# Patient Record
Sex: Female | Born: 1950
Health system: Southern US, Community
[De-identification: ages and names within clinical notes are randomized; demographics above are authoritative.]

## PROBLEM LIST (undated history)

## (undated) DIAGNOSIS — E669 Obesity, unspecified: Secondary | ICD-10-CM

## (undated) DIAGNOSIS — R011 Cardiac murmur, unspecified: Secondary | ICD-10-CM

## (undated) DIAGNOSIS — I10 Essential (primary) hypertension: Secondary | ICD-10-CM

## (undated) DIAGNOSIS — R509 Fever, unspecified: Secondary | ICD-10-CM

## (undated) DIAGNOSIS — E785 Hyperlipidemia, unspecified: Secondary | ICD-10-CM

## (undated) DIAGNOSIS — N3281 Overactive bladder: Secondary | ICD-10-CM

## (undated) DIAGNOSIS — G4700 Insomnia, unspecified: Secondary | ICD-10-CM

## (undated) HISTORY — DX: Essential (primary) hypertension: I10

## (undated) HISTORY — DX: Fever, unspecified: R50.9

## (undated) HISTORY — DX: Insomnia, unspecified: G47.00

## (undated) HISTORY — DX: Obesity, unspecified: E66.9

## (undated) HISTORY — DX: Overactive bladder: N32.81

## (undated) HISTORY — PX: TUBAL LIGATION: SHX77

## (undated) HISTORY — DX: Hyperlipidemia, unspecified: E78.5

---

## 2012-06-15 ENCOUNTER — Emergency Department (HOSPITAL_COMMUNITY): Payer: 59

## 2012-06-15 ENCOUNTER — Encounter (HOSPITAL_COMMUNITY): Payer: Self-pay | Admitting: Cardiology

## 2012-06-15 ENCOUNTER — Emergency Department (HOSPITAL_COMMUNITY)
Admission: EM | Admit: 2012-06-15 | Discharge: 2012-06-15 | Disposition: A | Discharge status: Home / Self Care | Payer: 59 | Attending: Emergency Medicine | Admitting: Emergency Medicine

## 2012-06-15 DIAGNOSIS — Z79899 Other long term (current) drug therapy: Secondary | ICD-10-CM | POA: Insufficient documentation

## 2012-06-15 DIAGNOSIS — M549 Dorsalgia, unspecified: Secondary | ICD-10-CM

## 2012-06-15 DIAGNOSIS — I10 Essential (primary) hypertension: Secondary | ICD-10-CM | POA: Insufficient documentation

## 2012-06-15 DIAGNOSIS — M545 Low back pain, unspecified: Secondary | ICD-10-CM | POA: Insufficient documentation

## 2012-06-15 HISTORY — DX: Essential (primary) hypertension: I10

## 2012-06-15 MED ORDER — OXYCODONE-ACETAMINOPHEN 5-325 MG PO TABS: 0.5000 | ORAL_TABLET | Freq: Four times a day (QID) | ORAL | Status: DC | PRN

## 2012-06-15 NOTE — ED Provider Notes (Signed)
History     CSN: [id number]  Arrival date & time 06/15/12  1156   First MD Initiated Contact with Patient 06/15/12 1219      Chief Complaint  Patient presents with  . Back Pain    (Consider location/radiation/quality/duration/timing/severity/associated sxs/prior treatment) HPI Comments: Patient presents with four-day history of lower back pain that started the day after cutting a tree in her yard. Patient states that she was outside all day. She complains of stiffness in her lower back. She saw an urgent care yesterday and prescribed a muscle relaxer which has not helped. She's been taking Aleve. Patient does not have any history of low back problems. She denies all red flag signs and symptoms of lower back pain, however she is age 62. No difficulty with ambulation. Onset of symptoms gradual. Course is constant. Nothing makes symptoms better. Movement and walking makes symptoms worse.  Patient is a 62 y.o. female presenting with back pain. The history is provided by the patient.  Back Pain Associated symptoms: no dysuria, no fever, no numbness, no pelvic pain and no weakness     Past Medical History  Diagnosis Date  . Hypertension     History reviewed. No pertinent past surgical history.  History reviewed. No pertinent family history.  History  Substance Use Topics  . Smoking status: Never Smoker   . Smokeless tobacco: Not on file  . Alcohol Use: No    OB History   Grav Para Term Preterm Abortions TAB SAB Ect Mult Living                  Review of Systems  Constitutional: Negative for fever and unexpected weight change.  Gastrointestinal: Negative for constipation.       Negative for fecal incontinence.   Genitourinary: Negative for dysuria, hematuria, flank pain, vaginal bleeding, vaginal discharge and pelvic pain.       Negative for urinary incontinence or retention.  Musculoskeletal: Positive for back pain.  Neurological: Negative for weakness and numbness.    Denies saddle paresthesias.    Allergies  Review of patient's allergies indicates no known allergies.  Home Medications   Current Outpatient Rx  Name  Route  Sig  Dispense  Refill  . hydrochlorothiazide (MICROZIDE) 12.5 MG capsule   Oral   Take 12.5 mg by mouth daily.         Marland Kitchen lisinopril (PRINIVIL,ZESTRIL) 20 MG tablet   Oral   Take 20 mg by mouth daily.         . naproxen sodium (ANAPROX) 220 MG tablet   Oral   Take 440 mg by mouth 2 (two) times daily as needed (for pain).         . simvastatin (ZOCOR) 20 MG tablet   Oral   Take 20 mg by mouth daily.           BP 139/53  Pulse 60  Temp(Src) 98.3 F (36.8 C) (Oral)  Resp 20  SpO2 99%  Physical Exam  Nursing note and vitals reviewed. Constitutional: She appears well-developed and well-nourished.  HENT:  Head: Normocephalic and atraumatic.  Eyes: Conjunctivae are normal.  Neck: Normal range of motion. Neck supple.  Pulmonary/Chest: Effort normal.  Abdominal: Soft. There is no tenderness. There is no CVA tenderness.  Musculoskeletal: Normal range of motion.       Cervical back: She exhibits no tenderness and no bony tenderness.       Thoracic back: She exhibits no tenderness and no bony tenderness.  Lumbar back: She exhibits tenderness. She exhibits normal range of motion and no bony tenderness.       Back:  No step-off noted with palpation of spine.   Neurological: She is alert. She has normal strength and normal reflexes. No sensory deficit.  5/5 strength in entire lower extremities bilaterally. No sensation deficit.   Skin: Skin is warm and dry. No rash noted.  Psychiatric: She has a normal mood and affect.    ED Course  Procedures (including critical care time)  Labs Reviewed - No data to display Dg Lumbar Spine Complete  06/15/2012  *RADIOLOGY REPORT*  Clinical Data: Back pain, injury  LUMBAR SPINE - COMPLETE 4+ VIEW  Comparison: None.  Findings: Normal lumbar spine alignment.  Preserved  vertebral body heights.  Mild diffuse degenerative disc disease and facet arthropathy.  No compression fracture, wedge shaped deformity or focal kyphosis.  No pars defects.  Pedicles appear intact.  Minor SI joint arthropathy.  Nonobstructive bowel gas pattern.  IMPRESSION: Degenerative changes.  No acute finding   Original Report Authenticated By: Judie Petit. Shick, M.D.      1. Back pain     12:52 PM Patient seen and examined. Work-up initiated. Pt requests back x-ray.   Vital signs reviewed and are as follows: Filed Vitals:   06/15/12 1202  BP: 139/53  Pulse: 60  Temp: 98.3 F (36.8 C)  Resp: 20   Result of back x-ray reviewed by myself. Patient informed.  No red flag s/s of low back pain. Patient was counseled on back pain precautions and told to do activity as tolerated but do not lift, push, or pull heavy objects more than 10 pounds for the next week.  Patient counseled to use ice or heat on back for no longer than 15 minutes every hour.   Patient prescribed narcotic pain medicine and counseled on proper use of narcotic pain medications. Counseled not to combine this medication with others containing tylenol. Told patient to discontinue home Tylenol #3.  Urged patient not to drink alcohol, drive, or perform any other activities that requires focus while taking either of these medications.  Patient urged to follow-up with PCP if pain does not improve with treatment and rest or if pain becomes recurrent. Urged to return with worsening severe pain, loss of bowel or bladder control, trouble walking.   The patient verbalizes understanding and agrees with the plan.    MDM  Patient with back pain. X-ray performed per patient request as well as age. No neurological deficits. Patient is ambulatory. No warning symptoms of back pain including: loss of bowel or bladder control, night sweats, waking from sleep with back pain, unexplained fevers or weight loss, h/o cancer, IVDU, recent trauma. No  concern for cauda equina, epidural abscess, or other serious cause of back pain. Conservative measures such as rest, ice/heat and pain medicine indicated with PCP follow-up if no improvement with conservative management.          Renne Crigler, PA-C 06/15/12 1458

## 2012-06-15 NOTE — ED Provider Notes (Signed)
Medical screening examination/treatment/procedure(s) were performed by non-physician practitioner and as supervising physician I was immediately available for consultation/collaboration.  Gloriana Piltz T Florine Sprenkle, MD 06/15/12 1640 

## 2012-06-15 NOTE — ED Notes (Signed)
Pt reports she worked in her yard on Sunday cutting up trees and is now having lower back pain. States she went to [facility] and given some Robaxin but it only makes her sleepy. Sensation intact.

## 2012-06-15 NOTE — ED Notes (Signed)
C/O lower right back pain down right thigh. No numbness.

## 2013-02-18 ENCOUNTER — Encounter (HOSPITAL_COMMUNITY): Payer: Self-pay | Admitting: Emergency Medicine

## 2013-02-18 ENCOUNTER — Emergency Department (HOSPITAL_COMMUNITY)
Admission: EM | Admit: 2013-02-18 | Discharge: 2013-02-18 | Disposition: A | Discharge status: Home / Self Care | Payer: 59 | Source: Home / Self Care | Attending: Family Medicine | Admitting: Family Medicine

## 2013-02-18 DIAGNOSIS — H1045 Other chronic allergic conjunctivitis: Secondary | ICD-10-CM

## 2013-02-18 DIAGNOSIS — H1013 Acute atopic conjunctivitis, bilateral: Secondary | ICD-10-CM

## 2013-02-18 NOTE — ED Notes (Signed)
Pt c/o eyes being irritated x 2 days. 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 She doesn't feel any foreign body in her eyes just irritation. Pt is alert and oriented and in no acute distress.

## 2013-02-18 NOTE — ED Provider Notes (Signed)
CSN: [id number]     Arrival date & time 02/18/13  1805 History   First MD Initiated Contact with Patient 02/18/13 1813     Chief Complaint  Patient presents with  . Eye Problem   (Consider location/radiation/quality/duration/timing/severity/associated sxs/prior Treatment) Patient is a 62 y.o. female presenting with eye problem. The history is provided by the patient.  Eye Problem Location:  Both Quality:  Burning Severity:  Mild Onset quality:  Gradual Duration:  2 days Progression:  Unchanged Chronicity:  New Relieved by:  None tried Associated symptoms: itching and tearing   Associated symptoms: no blurred vision, no crusting, no decreased vision, no discharge, no photophobia and no redness   Associated symptoms comment:  Gritty eye irritation. Risk factors comment:  Cheap eye make-up   Past Medical History  Diagnosis Date  . Hypertension    History reviewed. No pertinent past surgical history. No family history on file. History  Substance Use Topics  . Smoking status: Never Smoker   . Smokeless tobacco: Not on file  . Alcohol Use: No   OB History   Grav Para Term Preterm Abortions TAB SAB Ect Mult Living                 Review of Systems  Constitutional: Negative.   HENT: Negative.   Eyes: Positive for itching. Negative for blurred vision, photophobia, pain, discharge, redness and visual disturbance.    Allergies  Review of patient's allergies indicates no known allergies.  Home Medications   Current Outpatient Rx  Name  Route  Sig  Dispense  Refill  . hydrochlorothiazide (MICROZIDE) 12.5 MG capsule   Oral   Take 12.5 mg by mouth daily.         Marland Kitchen lisinopril (PRINIVIL,ZESTRIL) 20 MG tablet   Oral   Take 20 mg by mouth daily.         . naproxen sodium (ANAPROX) 220 MG tablet   Oral   Take 440 mg by mouth 2 (two) times daily as needed (for pain).         Marland Kitchen oxyCODONE-acetaminophen (PERCOCET/ROXICET) 5-325 MG per tablet   Oral   Take 0.5-1  tablets by mouth every 6 (six) hours as needed for pain.   8 tablet   0   . simvastatin (ZOCOR) 20 MG tablet   Oral   Take 20 mg by mouth daily.          BP 137/87  Pulse 86  Temp(Src) 98.4 F (36.9 C) (Oral)  Resp 19  SpO2 97% Physical Exam  Nursing note and vitals reviewed. Constitutional: She appears well-developed and well-nourished.  HENT:  Head: Normocephalic.  Right Ear: External ear normal.  Left Ear: External ear normal.  Mouth/Throat: Oropharynx is clear and moist.  Eyes: Conjunctivae and EOM are normal. Pupils are equal, round, and reactive to light. Right eye exhibits no discharge. Left eye exhibits no discharge. Right conjunctiva is not injected. Left conjunctiva is not injected.  Slit lamp exam:      The right eye shows no corneal abrasion, no corneal ulcer, no foreign body and no fluorescein uptake.      ED Course  Procedures (including critical care time) Labs Review Labs Reviewed - No data to display Imaging Review No results found.  EKG Interpretation    Date/Time:    Ventricular Rate:    PR Interval:    QRS Duration:   QT Interval:    QTC Calculation:   R Axis:  Text Interpretation:              MDM      Linna Hoff, MD 02/19/13 2114

## 2013-06-12 ENCOUNTER — Other Ambulatory Visit (HOSPITAL_COMMUNITY)
Admission: RE | Admit: 2013-06-12 | Discharge: 2013-06-12 | Disposition: A | Discharge status: Home / Self Care | Payer: 59 | Source: Ambulatory Visit | Attending: Family Medicine | Admitting: Family Medicine

## 2013-06-12 DIAGNOSIS — Z124 Encounter for screening for malignant neoplasm of cervix: Secondary | ICD-10-CM | POA: Insufficient documentation

## 2013-06-13 ENCOUNTER — Other Ambulatory Visit: Payer: Self-pay | Admitting: Family Medicine

## 2014-06-11 ENCOUNTER — Telehealth: Payer: Self-pay | Admitting: Cardiology

## 2014-06-12 ENCOUNTER — Encounter: Payer: Self-pay | Admitting: Cardiology

## 2014-06-12 ENCOUNTER — Telehealth: Payer: Self-pay | Admitting: Cardiology

## 2014-06-12 ENCOUNTER — Ambulatory Visit (INDEPENDENT_AMBULATORY_CARE_PROVIDER_SITE_OTHER): Payer: 59 | Admitting: Cardiology

## 2014-06-12 VITALS — BP 158/80 | HR 58 | Ht 63.0 in | Wt 203.5 lb

## 2014-06-12 DIAGNOSIS — I10 Essential (primary) hypertension: Secondary | ICD-10-CM

## 2014-06-12 DIAGNOSIS — R0609 Other forms of dyspnea: Secondary | ICD-10-CM

## 2014-06-12 DIAGNOSIS — R0600 Dyspnea, unspecified: Secondary | ICD-10-CM

## 2014-06-12 DIAGNOSIS — I359 Nonrheumatic aortic valve disorder, unspecified: Secondary | ICD-10-CM

## 2014-06-12 DIAGNOSIS — E785 Hyperlipidemia, unspecified: Secondary | ICD-10-CM

## 2014-06-12 DIAGNOSIS — G479 Sleep disorder, unspecified: Secondary | ICD-10-CM

## 2014-06-12 DIAGNOSIS — R079 Chest pain, unspecified: Secondary | ICD-10-CM

## 2014-06-12 NOTE — Patient Instructions (Signed)
Your physician has requested that you have an echocardiogram. Echocardiography is a painless test that uses sound waves to create images of your heart. It provides your doctor with information about the size and shape of your heart and how well your heart's chambers and valves are working. This procedure takes approximately one hour. There are no restrictions for this procedure.  Your physician has requested that you have en exercise stress myoview. For further information please visit [email or web address]. Please follow instruction sheet, as given.   Your physician has recommended that you have a sleep study. This test records several body functions during sleep, including: brain activity, eye movement, oxygen and carbon dioxide blood levels, heart rate and rhythm, breathing rate and rhythm, the flow of air through your mouth and nose, snoring, body muscle movements, and chest and belly movement.  Your physician recommends that you schedule a follow-up appointment after test echo , myoview --Dr Ellyn Hack

## 2014-06-12 NOTE — Telephone Encounter (Signed)
Patient will call to schedule the Split night sleep study ordered by Dr. Ellyn Hack.  She wants to check with her insurance company first.

## 2014-06-13 ENCOUNTER — Encounter (HOSPITAL_COMMUNITY): Payer: Self-pay | Admitting: *Deleted

## 2014-06-14 ENCOUNTER — Encounter: Payer: Self-pay | Admitting: Cardiology

## 2014-06-14 DIAGNOSIS — R0609 Other forms of dyspnea: Secondary | ICD-10-CM

## 2014-06-14 DIAGNOSIS — R079 Chest pain, unspecified: Secondary | ICD-10-CM | POA: Insufficient documentation

## 2014-06-14 DIAGNOSIS — I10 Essential (primary) hypertension: Secondary | ICD-10-CM | POA: Insufficient documentation

## 2014-06-14 DIAGNOSIS — I359 Nonrheumatic aortic valve disorder, unspecified: Secondary | ICD-10-CM | POA: Insufficient documentation

## 2014-06-14 DIAGNOSIS — E785 Hyperlipidemia, unspecified: Secondary | ICD-10-CM | POA: Insufficient documentation

## 2014-06-14 DIAGNOSIS — R0600 Dyspnea, unspecified: Secondary | ICD-10-CM | POA: Insufficient documentation

## 2014-06-14 DIAGNOSIS — G479 Sleep disorder, unspecified: Secondary | ICD-10-CM | POA: Insufficient documentation

## 2014-06-14 NOTE — Assessment & Plan Note (Signed)
L sided chest pain & arm tingling with  - relieved with rest in a 64 y/o obese post-menopausal woman with HTN, HLD (likely Metabolic Syndrome) -- at least moderate if not higher risk of cardiac etiology.  Plan: Will attempt TM Myoview - but option to convert to [place] (she is not sure if her back-leg pain will let her complete the [facility] Protocol) PRN NTG Rx On Statkin & ACE-I - hold off on BB given HR 58 @ baseline.

## 2014-06-14 NOTE — Telephone Encounter (Signed)
Close encounter 

## 2014-06-14 NOTE — Assessment & Plan Note (Signed)
SEM on exam with h/o severe childhood illness - ? Rheumatic Fever.  Not sure if this could be be delayed presentation of Rheumatic Valve disease.  Check 2D Echo

## 2014-06-14 NOTE — Progress Notes (Signed)
PATIENT: Linda Harvey MRN: [id number] DOB: 03/13/1951 PCP: Reginia Naas, MD  Clinic Note: Chief Complaint  Patient presents with  . New Evaluation    WHEN WALKING UP HILL HAS CHEST DISCOMFORT OR OVER EXERTION , NO PROBLEM WHEN DOING WATER AEROBIC , NO SOB , NO EDEMA- NO ENERGY  . Chest Pain    HPI: Linda Harvey is a 64 y.o. female with a PMH below who presents today for initial cardiology consultation for exertional chest discomfort and arm pain.  She has been trying to get increasing exercise when walking with her husband she was seen by her PCP on March 8th , and is now with cardiology evaluation.  Interval History: She is a mildly obese woman who seems somewhat depressed affect today.  She has been trying to do more exercise with her husband, and started noticing left-sided chest pressure and a left sided arm tingling associated with dyspnea.  She also will feel somewhat queasy after exercising.  She does not note these symptoms with just daily walking around, only when she is "walking with a purpose".  She has not noted any of these symptoms when doing her water aerobic exercises.  The symptoms are ~4-5/10 in intensity & are alleviated with rest.  She occasionally notes having burst of rapid HR but no sense of passing out.   In addition to her exertional symptoms, she essentially notes feeling tired & lethargic during the day & never feels as though she gets a good night of rest.   Her Epworth Scale is as follows: Situation   Chance of Dozing/Sleeping (0 = never , 1 = slight chance , 2 = moderate chance , 3 = high chance )   sitting and reading 3   watching TV 3   sitting inactive in a public place 3   being a passenger in a motor vehicle for an hour or more 2   lying down in the afternoon 3   sitting and talking to someone 1   sitting quietly after lunch (no alcohol) 3   while stopped for a few minutes in traffic as the driver 0   Total Score  18   The remainder of  her Cardiovascular ROS  is as follows: positive for - chest pain, dyspnea on exertion, rapid heart rate and L arm tingling, fatigue negative for - edema, irregular heartbeat, loss of consciousness, orthopnea, palpitations, paroxysmal nocturnal dyspnea, shortness of breath or TIA/amaurosis fugax, syncope/near syncope :  Past Medical History  Diagnosis Date  . Essential hypertension   . Hyperlipidemia   . Obesity (BMI 35.0-39.9 without comorbidity)   . Insomnia   . Overactive bladder   . Severe Acute Febrile Illness as Child     Prior Cardiac Evaluation and Past Surgical History: Past Surgical History  Procedure Laterality Date  . Tubal ligation Bilateral     No Known Allergies  Current Outpatient Prescriptions  Medication Sig Dispense Refill  . hydrochlorothiazide (MICROZIDE) 12.5 MG capsule Take 12.5 mg by mouth daily.    Marland Kitchen lisinopril (PRINIVIL,ZESTRIL) 20 MG tablet Take 20 mg by mouth daily.    . naproxen sodium (ANAPROX) 220 MG tablet Take 440 mg by mouth 2 (two) times daily as needed (for pain).    Marland Kitchen NITROSTAT 0.4 MG SL tablet     . oxybutynin (DITROPAN) 5 MG tablet Take 5 mg by mouth 2 (two) times daily.  5  . simvastatin (ZOCOR) 20 MG tablet Take 20 mg by mouth daily.  No current facility-administered medications for this visit.   History   Social History  . Marital Status: Married    Spouse Name: N/A  . Number of Children: N/A  . Years of Education: N/A   Social History Main Topics  . Smoking status: Never Smoker   . Smokeless tobacco: Not on file  . Alcohol Use: No  . Drug Use: No  . Sexual Activity: Not on file   Other Topics Concern  . None   Social History Narrative   Lives with he husband - likes to go walking with him   Notes that has not had any "sex drive" x ~2 yrs.   Never smoked.  No EtOH.    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   family history includes Cancer in her father; Diabetes in her mother and son; Epilepsy in her son.  ROS: A  comprehensive Review of Systems - was performed. Review of Systems  Constitutional: Positive for malaise/fatigue. Negative for weight loss.  Cardiovascular: Positive for palpitations.  Gastrointestinal: Negative for abdominal pain, blood in stool and melena.  Genitourinary: Negative for hematuria.  Musculoskeletal: Positive for joint pain. Negative for myalgias.       L leg - ? Neuropathic shooting pain when walking & occasionally @ night  Neurological: Negative.  Negative for sensory change, speech change and focal weakness.  Endo/Heme/Allergies: Negative.   Psychiatric/Behavioral: The patient is nervous/anxious and has insomnia.        Notes anhedonia, poor eating habits, poor sleeping habits & appears to have low self esteem  All other systems reviewed and are negative.   PHYSICAL EXAM BP 158/80 mmHg  Pulse 58  Ht 5\' 3"  (1.6 m)  Wt 203 lb 8 oz (92.307 kg)  BMI 36.06 kg/m2 General appearance: alert, cooperative, appears stated age, no distress, moderately obese and depressed mood & affect Neck: no adenopathy, no carotid bruit, supple, symmetrical, trachea midline and JVP ~10-12 cmH20 with mild HJR Lungs: clear to auscultation bilaterally, normal percussion bilaterally and non-labored, good air movement Heart: RRR, normal S1 & S2; 2-3/6 c-d SEM @ RUSB--> carotid; non-displaced PMI, NO R/G Abdomen: soft, non-tender; bowel sounds normal; no masses,  no organomegaly and truncal & waist/buttock obesity Extremities: No C/C with ~1+ Bilateral ankle edema Pulses: 2+ and symmetric Skin: Skin color, texture, turgor normal. No rashes or lesions Neurologic: Alert and oriented X 3, normal strength and tone. Normal symmetric reflexes. Normal coordination and gait Cranial nerves: normal   Adult ECG Report  Rate: 58 ;  Rhythm: sinus bradycardia  QRS Axis: -19 (leftward) ;  PR Interval: 126 ;  QRS Duration: 90 ; QTc: 398;  Voltages: minimal criteria for LVH  Conduction Disturbances: none  Other  Abnormalities: non-specific ST-T changes  Narrative Interpretation: Mildly abnormal EKG but no ischemic findings.  Recent Labs: pending labs checked by PCP.  ASSESSMENT / PLAN:  Chest pain with moderate risk for cardiac etiology L sided chest pain & arm tingling with  - relieved with rest in a 64 y/o obese post-menopausal woman with HTN, HLD (likely Metabolic Syndrome) -- at least moderate if not higher risk of cardiac etiology.  Plan: Will attempt TM Myoview - but option to convert to [place] (she is not sure if her back-leg pain will let her complete the [facility] Protocol) PRN NTG Rx On Statkin & ACE-I - hold off on BB given HR 58 @ baseline.   DOE (dyspnea on exertion) May be multifactorial, but concerning for Anginal equivalent -->  see above Also checking 2 D Echo    Aortic valve disease - Systolic ejection murmur SEM on exam with h/o severe childhood illness - ? Rheumatic Fever.  Not sure if this could be be delayed presentation of Rheumatic Valve disease.  Check 2D Echo   Difficulty sleeping She has a Dx of Insomnia - but with Epworth Scale of 18, concern is for OSA  Plan: Sleep Studye (Polysomnogram)     Orders Placed This Encounter  Procedures  . Myocardial Perfusion Imaging    Standing Status: Future     Number of Occurrences:      Standing Expiration Date: 06/12/2015    Order Specific Question:  Where should this test be performed    Answer:  [facility]    Order Specific Question:  Type of stress    Answer:  Exercise    Order Specific Question:  Patient weight in lbs    Answer:  203  . EKG 12-Lead  . 2D Echocardiogram without contrast    Standing Status: Future     Number of Occurrences:      Standing Expiration Date: 06/12/2015    Order Specific Question:  Type of Echo    Answer:  Complete    Order Specific Question:  Where should this test be performed    Answer:  [facility]    Order Specific Question:  Reason for exam-Echo    Answer:   Murmur  785.2 / R01.1    Order Specific Question:  Reason for exam-Echo    Answer:  Dyspnea  786.09 / R06.00    Order Specific Question:  Reason for exam-Echo    Answer:  Chest Pain  786.50 / R07.9  . Split night study    Standing Status: Future     Number of Occurrences:      Standing Expiration Date: 06/13/2015    Scheduling Instructions:     EPIWORTH SCORE OF 18     PLEASE HAVE DR [place] STUDY    Order Specific Question:  Where should this test be performed:    Answer:  [place] ordered this encounter  Medications  . oxybutynin (DITROPAN) 5 MG tablet    Sig: Take 5 mg by mouth 2 (two) times daily.    Refill:  5  . NITROSTAT 0.4 MG SL tablet    Sig:     Followup: ~1 months  DAVID W. Ellyn Hack, M.D., M.S. Interventional Cardiolgy [facility]

## 2014-06-14 NOTE — Assessment & Plan Note (Signed)
May be multifactorial, but concerning for Anginal equivalent --> see above Also checking 2 D Echo

## 2014-06-14 NOTE — Assessment & Plan Note (Signed)
She has a Dx of Insomnia - but with Epworth Scale of 18, concern is for OSA  Plan: Sleep Studye (Polysomnogram)

## 2014-06-20 ENCOUNTER — Telehealth (HOSPITAL_COMMUNITY): Payer: Self-pay

## 2014-06-20 NOTE — Telephone Encounter (Signed)
Encounter complete. 

## 2014-06-21 ENCOUNTER — Encounter: Payer: Self-pay | Admitting: *Deleted

## 2014-06-25 ENCOUNTER — Ambulatory Visit (HOSPITAL_COMMUNITY)
Admission: RE | Admit: 2014-06-25 | Discharge: 2014-06-25 | Disposition: A | Discharge status: Home / Self Care | Payer: 59 | Source: Ambulatory Visit | Attending: Cardiovascular Disease | Admitting: Cardiovascular Disease

## 2014-06-25 ENCOUNTER — Ambulatory Visit (HOSPITAL_BASED_OUTPATIENT_CLINIC_OR_DEPARTMENT_OTHER)
Admission: RE | Admit: 2014-06-25 | Discharge: 2014-06-25 | Disposition: A | Discharge status: Home / Self Care | Payer: 59 | Source: Ambulatory Visit | Attending: Cardiovascular Disease | Admitting: Cardiovascular Disease

## 2014-06-25 DIAGNOSIS — R0600 Dyspnea, unspecified: Secondary | ICD-10-CM | POA: Diagnosis not present

## 2014-06-25 DIAGNOSIS — R42 Dizziness and giddiness: Secondary | ICD-10-CM | POA: Diagnosis not present

## 2014-06-25 DIAGNOSIS — I359 Nonrheumatic aortic valve disorder, unspecified: Secondary | ICD-10-CM | POA: Insufficient documentation

## 2014-06-25 DIAGNOSIS — R079 Chest pain, unspecified: Secondary | ICD-10-CM | POA: Diagnosis not present

## 2014-06-25 DIAGNOSIS — E785 Hyperlipidemia, unspecified: Secondary | ICD-10-CM | POA: Insufficient documentation

## 2014-06-25 DIAGNOSIS — R9431 Abnormal electrocardiogram [ECG] [EKG]: Secondary | ICD-10-CM | POA: Diagnosis not present

## 2014-06-25 DIAGNOSIS — E669 Obesity, unspecified: Secondary | ICD-10-CM | POA: Insufficient documentation

## 2014-06-25 DIAGNOSIS — I10 Essential (primary) hypertension: Secondary | ICD-10-CM | POA: Diagnosis not present

## 2014-06-25 DIAGNOSIS — G479 Sleep disorder, unspecified: Secondary | ICD-10-CM | POA: Insufficient documentation

## 2014-06-25 DIAGNOSIS — R0609 Other forms of dyspnea: Secondary | ICD-10-CM

## 2014-06-25 HISTORY — PX: NM MYOVIEW LTD: HXRAD82

## 2014-06-25 HISTORY — PX: TRANSTHORACIC ECHOCARDIOGRAM: SHX275

## 2014-06-25 MED ORDER — TECHNETIUM TC 99M SESTAMIBI GENERIC - CARDIOLITE
30.0000 | Freq: Once | INTRAVENOUS | Status: AC | PRN
  Administered 2014-06-25: 30 via INTRAVENOUS

## 2014-06-25 MED ORDER — TECHNETIUM TC 99M SESTAMIBI GENERIC - CARDIOLITE
10.0000 | Freq: Once | INTRAVENOUS | Status: AC | PRN
  Administered 2014-06-25: 10 via INTRAVENOUS

## 2014-06-25 NOTE — Progress Notes (Signed)
Quick Note:  Stress Test looked good!! No sign of significant Heart Artery Disease. Pump function is normal.  Good news!!.  HARDING,DAVID W, MD  ______ 

## 2014-06-25 NOTE — Progress Notes (Signed)
2D Echocardiogram Complete.  06/25/2014   Eran Windish, RDCS  

## 2014-06-25 NOTE — Procedures (Addendum)
[place] [facility] [address] [place] [address] [phone]  Cardiology Nuclear Med Study  Linda Harvey is a 64 y.o. female     MRN : [id number]     DOB: 04/28/1950  Procedure Date: 06/25/2014  Nuclear Med Background Indication for Stress Test:  Evaluation for Ischemia and Abnormal EKG History:  AVDs w/systolic ejection murmur;No prior respiratory history reported; N prior NUC MPI for comparison. Cardiac Risk Factors: Hypertension, Lipids and Obesity  Symptoms:  Chest Pain, DOE and Light-Headedness   Nuclear Pre-Procedure Caffeine/Decaff Intake:  7:00pm NPO After: 3:00am   IV Site: R Antecubital  IV 0.9% NS with Angio Cath:  22g  Chest Size (in):  n/a IV Started by: Rolene Course, RN  Height: 5\' 3"  (1.6 m)  Cup Size: C  BMI:  Body mass index is 35.97 kg/(m^2). Weight:  203 lb (92.08 kg)   Tech Comments:  n/a    Nuclear Med Study 1 or 2 day study: 1 day  Stress Test Type:  Stress  Order Authorizing Provider:  Glenetta Hew, MD   Resting Radionuclide: Technetium 6m Sestamibi  Resting Radionuclide Dose: 10.4 mCi   Stress Radionuclide:  Technetium 31m Sestamibi  Stress Radionuclide Dose: 30.8 mCi           Stress Protocol Rest HR: 55 Stress HR: 144  Rest BP: 152/87 Stress BP: 204/124  Exercise Time (min): 7:34 METS: 9.4   Predicted Max HR: 156 bpm % Max HR: 92.31 bpm Rate Pressure Product: [phone]  Dose of Adenosine (mg):  n/a Dose of Lexiscan: n/a mg  Dose of Atropine (mg): n/a Dose of Dobutamine: n/a mcg/kg/min (at max HR)  Stress Test Technologist: Leane Para, CCT Nuclear Technologist: Anthony Sar   Rest Procedure:  Myocardial perfusion imaging was performed at rest 45 minutes following the intravenous administration of Technetium 61m Sestamibi. Stress Procedure:  The patient performed treadmill exercise using a Bruce  Protocol for 7:34 minutes. The patient stopped due to HTN and SOB and denied  any chest pain.  There were no significant ST-T wave changes.  Technetium 66m Sestamibi was injected at peak exercise and myocardial perfusion imaging was performed after a brief delay.  Transient Ischemic Dilatation (Normal <1.22):  1.07  QGS EDV:  72 ml QGS ESV:  22 ml LV Ejection Fraction: 70%       Rest ECG: NSR - Normal EKG  Stress ECG: Significant ST abnormalities consistent with ischemia.  QPS Raw Data Images:  Normal; no motion artifact; normal heart/lung ratio. Stress Images:  Normal homogeneous uptake in all areas of the myocardium. Rest Images:  Normal homogeneous uptake in all areas of the myocardium. Subtraction (SDS):  No evidence of ischemia.  Impression Exercise Capacity:  Good exercise capacity. BP Response:  Hypertensive blood pressure response. Clinical Symptoms:  No significant symptoms noted. ECG Impression:  Significant ST abnormalities consistent with ischemia. Comparison with Prior Nuclear Study: No images to compare  Overall Impression:  Low risk stress nuclear study Nl perfusion with + GXT.  LV Wall Motion:  NL LV Function; NL Wall Motion   [name], MD  06/25/2014 1:12 PM

## 2014-06-26 ENCOUNTER — Telehealth: Payer: Self-pay | Admitting: *Deleted

## 2014-06-26 NOTE — Telephone Encounter (Signed)
-----   Message from Leonie Man, MD sent at 06/25/2014  9:41 PM EDT ----- Stress Test looked good!! No sign of significant Heart Artery Disease.  Pump function is normal.  Good news!!.  Leonie Man, MD

## 2014-06-26 NOTE — Progress Notes (Signed)
Quick Note:  Echo results: Good news: Essentially pretty good echocardiogram --> normal pump function and normal valve function (Tricuspid regurgitation is not all that concerning  EF: 55-60%. Grade 1 Diastolic Dysfunction = not unexpected for age --> indicates mlid stiffening (poor relaxation / filling of the pumping chamber) -- > can be related to Exertional Dyspnea (if strenuous, or if also deconditioned) No regional wall motion abnormalities = No signs to suggest heart attack..   DH  ______

## 2014-06-26 NOTE — Telephone Encounter (Signed)
SPOKE TO SON RESULTS GIVEN HE STATES HE WILL RELAY MESSAGE.

## 2014-06-26 NOTE — Telephone Encounter (Signed)
-----   Message from Leonie Man, MD sent at 06/26/2014  1:29 AM EDT ----- Echo results: Good news: Essentially pretty good echocardiogram --> normal pump function and normal valve function (Tricuspid regurgitation is not all that concerning   EF: 55-60%. Grade 1 Diastolic Dysfunction = not unexpected for age --> indicates mlid stiffening (poor relaxation / filling of the pumping chamber) -- > can be related to Exertional Dyspnea (if strenuous, or if also deconditioned) No regional wall motion abnormalities =  No signs to suggest heart attack.Marland Kitchen   Linda Harvey

## 2014-07-15 ENCOUNTER — Encounter: Payer: Self-pay | Admitting: Cardiology

## 2014-07-15 ENCOUNTER — Ambulatory Visit (INDEPENDENT_AMBULATORY_CARE_PROVIDER_SITE_OTHER): Payer: 59 | Admitting: Cardiology

## 2014-07-15 VITALS — BP 128/80 | HR 68 | Ht 64.0 in | Wt 185.0 lb

## 2014-07-15 DIAGNOSIS — I10 Essential (primary) hypertension: Secondary | ICD-10-CM | POA: Diagnosis not present

## 2014-07-15 DIAGNOSIS — G479 Sleep disorder, unspecified: Secondary | ICD-10-CM | POA: Diagnosis not present

## 2014-07-15 DIAGNOSIS — R0609 Other forms of dyspnea: Secondary | ICD-10-CM

## 2014-07-15 DIAGNOSIS — R079 Chest pain, unspecified: Secondary | ICD-10-CM

## 2014-07-15 DIAGNOSIS — R0600 Dyspnea, unspecified: Secondary | ICD-10-CM

## 2014-07-15 DIAGNOSIS — I359 Nonrheumatic aortic valve disorder, unspecified: Secondary | ICD-10-CM

## 2014-07-15 DIAGNOSIS — E785 Hyperlipidemia, unspecified: Secondary | ICD-10-CM

## 2014-07-15 NOTE — Progress Notes (Signed)
PATIENT: Linda Harvey MRN: [id number] DOB: May 18, 1950 PCP: Reginia Naas, MD  Clinic Note: Chief Complaint  Patient presents with  . Follow-up    Echo and stress test  . Chest Pain  . Shortness of Breath    HPI: Linda Harvey is a 64 y.o. female with a PMH below who presents today for initial cardiology consultation for exertional chest discomfort and arm pain.  She has been trying to get increasing exercise when walking with her husband she was seen by her PCP on March 8th , and is now with cardiology evaluation.  Interval History: No longer doing strenuous exertion with hiking, but able to do routine TM & has really gotten into doing swimming exercises. No more palpitations.  Did well on GXT portion of ST - but hadf EKG changes - no ischemia on imaging (LOW RISK). Since then, no further CP-arm.  Better energy level.  No palpitations.  No DOE.   Cardiovascular ROS  is as follows: no chest pain or dyspnea on exertion positive for - rapid heart rate and palpitations are better negative for - edema, irregular heartbeat, loss of consciousness, orthopnea, palpitations, paroxysmal nocturnal dyspnea, rapid heart rate, shortness of breath or TIA/amaurosis fugax, syncope/near syncope :  Lots of stress caring for disabled son.  Her husband wants to move & that will leave son without a caregiver.  She is feeling torn.  Past Medical History  Diagnosis Date  . Essential hypertension   . Hyperlipidemia   . Obesity (BMI 35.0-39.9 without comorbidity)   . Insomnia   . Overactive bladder   . Severe Acute Febrile Illness as Child     Prior Cardiac Evaluation and Past Surgical History: Past Surgical History  Procedure Laterality Date  . Tubal ligation Bilateral   . Transthoracic echocardiogram  06/25/14     LV size & function - EF 55-60%, Gr 1 DD, mild-mod TR, Tr MR; Mild Aortic Sclerosis  . Nm myoview ltd  06/25/14    Low Risk ST - normal perfusion, abnormal GXT, EF ~70%   No  Known Allergies  Current Outpatient Prescriptions  Medication Sig Dispense Refill  . lisinopril-hydrochlorothiazide (PRINZIDE,ZESTORETIC) 20-25 MG per tablet Take 1 tablet by mouth daily.    Marland Kitchen NITROSTAT 0.4 MG SL tablet Place 0.4 mg under the tongue every 5 (five) minutes as needed.     Marland Kitchen oxybutynin (DITROPAN) 5 MG tablet Take 5 mg by mouth 2 (two) times daily.  5  . simvastatin (ZOCOR) 20 MG tablet Take 20 mg by mouth daily.     No current facility-administered medications for this visit.   History   Social History  . Marital Status: Married    Spouse Name: N/A  . Number of Children: N/A  . Years of Education: N/A   Social History Main Topics  . Smoking status: Never Smoker   . Smokeless tobacco: Not on file  . Alcohol Use: No  . Drug Use: No  . Sexual Activity: Not on file   Other Topics Concern  . None   Social History Narrative   Lives with he husband - likes to go walking with him   Notes that has not had any "sex drive" x ~2 yrs.   Never smoked.  No EtOH.    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   family history includes Cancer in her father; Diabetes in her mother, sister, and son; Epilepsy in her son.  ROS: A comprehensive Review of Systems - was performed. Review of  Systems  Constitutional: Malaise/fatigue: Much more energy with routine exercise.  HENT: Negative for nosebleeds.   Respiratory: Negative for cough and shortness of breath.   Cardiovascular: Negative for palpitations, claudication and leg swelling.  Musculoskeletal: Positive for back pain (chronic).  Psychiatric/Behavioral: Positive for depression (much better). The patient is nervous/anxious.        Mood much better with having found exercise that she enjoys.  All other systems reviewed and are negative.   PHYSICAL EXAM BP 128/80 mmHg  Pulse 68  Ht 5\' 4"  (1.626 m)  Wt 185 lb (83.915 kg)  BMI 31.74 kg/m2 General appearance: alert, cooperative, appears stated age, no distress, moderately obese  and depressed mood & affect Neck: no adenopathy, no carotid bruit, supple, symmetrical, trachea midline and JVP ~10-12 cmH20 with mild HJR Lungs: clear to auscultation bilaterally, normal percussion bilaterally and non-labored, good air movement Heart: RRR, normal S1 & S2; 2-3/6 c-d SEM @ RUSB--> carotid; non-displaced PMI, NO R/G Abdomen: soft, non-tender; bowel sounds normal; no masses,  no organomegaly and truncal & waist/buttock obesity Extremities: No C/C with ~1+ Bilateral ankle edema Pulses: 2+ and symmetric Skin: Skin color, texture, turgor normal. No rashes or lesions Neurologic: Alert and oriented X 3, normal strength and tone. Normal symmetric reflexes. Normal coordination and gait Cranial nerves: normal   Adult ECG Report - not checked  Recent Labs: pending labs checked by PCP.  ASSESSMENT / PLAN: Problem List Items Addressed This Visit    Aortic valve disease - Systolic ejection murmur    Aortic sclerosis but no stenosis. No need to reassess him as murmur worsens.      Relevant Medications   lisinopril-hydrochlorothiazide (PRINZIDE,ZESTORETIC) 20-25 MG per tablet   Chest pain with moderate risk for cardiac etiology - Primary    She is not anymore episodes of chest discomfort that was concerning. She is not exerting result of the level that she had been. She thinks she may go into further on the treadmill portion and was actually done. Perhaps if she did it herself even further there may have been some symptoms. EKG section was abnormal but the imaging was not. This could be indicative of maybe having some microvascular ischemia. Cannot exclude that. Less likely to have macrovascular ischemia in the setting of normal perfusion.  Recommendations here are for aggressive risk factor modification with lipid control and blood pressure control. Also potentially consider when necessary nitroglycerin. In addition recommend continued exercise with her something and gradually build up  what she'll do a treadmill to see if she still has symptoms.  Reassess in 6 months.      Difficulty sleeping    Has sleep study pending.      DOE (dyspnea on exertion)    Less noticeable. Could very well be related to microvascular ischemia mediated diastolic dysfunction that is not seen on Myoview. The echo did show only mild diastolic dysfunction, but this could be made worse with exertion, especially setting of microvascular ischemia. Continue aggressive risk factor modification and exercise.      Essential hypertension (Chronic)    Blood pressure does look quite stable. She is on current dose of ACE inhibitor and HCTZ which is stable. I would like to consider beta blocker, with her borderline psychiatric symptoms of mild depression and reluctant to use a beta blocker at this juncture.      Relevant Medications   lisinopril-hydrochlorothiazide (PRINZIDE,ZESTORETIC) 20-25 MG per tablet   Hyperlipidemia with target LDL less than 100 (Chronic)  Currently on statin and monitor by PCP. Target should be reduced down to less than 100 in the setting of possible microvascular disease.      Relevant Medications   lisinopril-hydrochlorothiazide (PRINZIDE,ZESTORETIC) 20-25 MG per tablet      No orders of the defined types were placed in this encounter.   Meds ordered this encounter  Medications  . lisinopril-hydrochlorothiazide (PRINZIDE,ZESTORETIC) 20-25 MG per tablet    Sig: Take 1 tablet by mouth daily.    Followup: ~6 months  Shenique Childers W. Ellyn Hack, M.D., M.S. Interventional Cardiolgy [facility]

## 2014-07-15 NOTE — Assessment & Plan Note (Signed)
Has sleep study pending.

## 2014-07-15 NOTE — Assessment & Plan Note (Signed)
Less noticeable. Could very well be related to microvascular ischemia mediated diastolic dysfunction that is not seen on Myoview. The echo did show only mild diastolic dysfunction, but this could be made worse with exertion, especially setting of microvascular ischemia. Continue aggressive risk factor modification and exercise.

## 2014-07-15 NOTE — Assessment & Plan Note (Signed)
She is not anymore episodes of chest discomfort that was concerning. She is not exerting result of the level that she had been. She thinks she may go into further on the treadmill portion and was actually done. Perhaps if she did it herself even further there may have been some symptoms. EKG section was abnormal but the imaging was not. This could be indicative of maybe having some microvascular ischemia. Cannot exclude that. Less likely to have macrovascular ischemia in the setting of normal perfusion.  Recommendations here are for aggressive risk factor modification with lipid control and blood pressure control. Also potentially consider when necessary nitroglycerin. In addition recommend continued exercise with her something and gradually build up what she'll do a treadmill to see if she still has symptoms.  Reassess in 6 months.

## 2014-07-15 NOTE — Assessment & Plan Note (Signed)
Blood pressure does look quite stable. She is on current dose of ACE inhibitor and HCTZ which is stable. I would like to consider beta blocker, with her borderline psychiatric symptoms of mild depression and reluctant to use a beta blocker at this juncture.

## 2014-07-15 NOTE — Patient Instructions (Signed)
No change with current medications  Your physician wants you to follow-up in 6 month Dr Ellyn Hack.  You will receive a reminder letter in the mail two months in advance. If you don't receive a letter, please call our office to schedule the follow-up appointment.

## 2014-07-15 NOTE — Assessment & Plan Note (Signed)
Currently on statin and monitor by PCP. Target should be reduced down to less than 100 in the setting of possible microvascular disease.

## 2014-07-15 NOTE — Assessment & Plan Note (Signed)
Aortic sclerosis but no stenosis. No need to reassess him as murmur worsens.

## 2014-08-26 ENCOUNTER — Encounter (HOSPITAL_BASED_OUTPATIENT_CLINIC_OR_DEPARTMENT_OTHER): Payer: 59

## 2014-12-31 ENCOUNTER — Emergency Department (HOSPITAL_COMMUNITY)
Admission: EM | Admit: 2014-12-31 | Discharge: 2014-12-31 | Disposition: A | Discharge status: Home / Self Care | Payer: 59 | Attending: Emergency Medicine | Admitting: Emergency Medicine

## 2014-12-31 ENCOUNTER — Encounter (HOSPITAL_COMMUNITY): Payer: Self-pay | Admitting: Emergency Medicine

## 2014-12-31 DIAGNOSIS — Z87448 Personal history of other diseases of urinary system: Secondary | ICD-10-CM | POA: Insufficient documentation

## 2014-12-31 DIAGNOSIS — E785 Hyperlipidemia, unspecified: Secondary | ICD-10-CM | POA: Diagnosis not present

## 2014-12-31 DIAGNOSIS — I10 Essential (primary) hypertension: Secondary | ICD-10-CM | POA: Diagnosis not present

## 2014-12-31 DIAGNOSIS — Z79899 Other long term (current) drug therapy: Secondary | ICD-10-CM | POA: Diagnosis not present

## 2014-12-31 DIAGNOSIS — M79604 Pain in right leg: Secondary | ICD-10-CM | POA: Diagnosis not present

## 2014-12-31 DIAGNOSIS — Z8669 Personal history of other diseases of the nervous system and sense organs: Secondary | ICD-10-CM | POA: Insufficient documentation

## 2014-12-31 DIAGNOSIS — E669 Obesity, unspecified: Secondary | ICD-10-CM | POA: Diagnosis not present

## 2014-12-31 MED ORDER — CYCLOBENZAPRINE HCL 10 MG PO TABS
5.0000 mg | ORAL_TABLET | Freq: Once | ORAL | Status: AC
  Administered 2014-12-31: 5 mg via ORAL
  Filled 2014-12-31: qty 1

## 2014-12-31 MED ORDER — CYCLOBENZAPRINE HCL 5 MG PO TABS: 5.0000 mg | ORAL_TABLET | Freq: Three times a day (TID) | ORAL | Status: DC

## 2014-12-31 NOTE — ED Notes (Signed)
Pt states that she has had intermittent R inner thigh pain x 2 days. Alert and oriented. Denies back pain.

## 2014-12-31 NOTE — ED Provider Notes (Signed)
CSN: [id number]     Arrival date & time 12/31/14  2209 History  By signing my name below, I, Emmanuella Mensah, attest that this documentation has been prepared under the direction and in the presence of Charlann Lange, PA-C. Electronically Signed: Judithann Sauger, ED Scribe. 12/31/2014. 11:38 PM.    Chief Complaint  Patient presents with  . Leg Pain   The history is provided by the patient. No language interpreter was used.   HPI Comments: Linda Harvey is a 64 y.o. female who presents to the Emergency Department complaining of gradually worsening moderate non-radiating "tightening" right inner thigh pain onset 2 days ago. She explains that the she feels the pain upon movement and denies pain when the area is pushed. She denies any swelling or back pain. She states that she used Bengay, BC, Ibuprofen, and OTC arthritis medication with no relief. She reports that she used [facility] for about 3 weeks to give her energy but she had a severe UTI which was resolved after her antibiotic course. She adds that she stopped taking the [place] 1 week ago and is unsure if these present symptoms are related to that. She reports NKDA.   PCP: Dr. Tamala Julian, [facility]   Past Medical History  Diagnosis Date  . Essential hypertension   . Hyperlipidemia   . Obesity (BMI 35.0-39.9 without comorbidity)   . Insomnia   . Overactive bladder   . Severe Acute Febrile Illness as Child    Past Surgical History  Procedure Laterality Date  . Tubal ligation Bilateral   . Transthoracic echocardiogram  06/25/14     LV size & function - EF 55-60%, Gr 1 DD, mild-mod TR, Tr MR; Mild Aortic Sclerosis  . Nm myoview ltd  06/25/14    Low Risk ST - normal perfusion, abnormal GXT, EF ~70%   Family History  Problem Relation Age of Onset  . Diabetes Mother   . Cancer Father   . Diabetes Son   . Epilepsy Son   . Diabetes Sister    Social History  Substance Use Topics  . Smoking status: Never Smoker   . Smokeless  tobacco: None  . Alcohol Use: No   OB History    No data available     Review of Systems  Constitutional: Negative for fever.  Musculoskeletal: Positive for myalgias and arthralgias. Negative for back pain and joint swelling.      Allergies  Review of patient's allergies indicates no known allergies.  Home Medications   Prior to Admission medications   Medication Sig Start Date End Date Taking? Authorizing Zamarion Longest  lisinopril-hydrochlorothiazide (PRINZIDE,ZESTORETIC) 20-25 MG per tablet Take 1 tablet by mouth daily. 07/11/14   Historical Sheryle Vice, MD  NITROSTAT 0.4 MG SL tablet Place 0.4 mg under the tongue every 5 (five) minutes as needed.  06/11/14   Historical Kaseem Vastine, MD  oxybutynin (DITROPAN) 5 MG tablet Take 5 mg by mouth 2 (two) times daily. 05/26/14   Historical Karlyn Glasco, MD  simvastatin (ZOCOR) 20 MG tablet Take 20 mg by mouth daily.    Historical Okie Bogacz, MD   BP 153/93 mmHg  Pulse 64  Temp(Src) 98.4 F (36.9 C) (Oral)  Resp 18  Ht 5\' 4"  (1.626 m)  Wt 185 lb (83.915 kg)  BMI 31.74 kg/m2  SpO2 95% Physical Exam  Constitutional: She is oriented to person, place, and time. She appears well-developed and well-nourished.  HENT:  Head: Normocephalic and atraumatic.  Cardiovascular: Normal rate.   Pulmonary/Chest: Effort normal.  Abdominal: She exhibits no distension.  Musculoskeletal: She exhibits no edema.  Right lower extremity without swelling or redness No reproducible tenderness to anterior thigh or groin Pain with abduction  Distal pulses intact   Neurological: She is alert and oriented to person, place, and time.  Skin: Skin is warm and dry.  Psychiatric: She has a normal mood and affect.  Nursing note and vitals reviewed.   ED Course  Procedures (including critical care time) DIAGNOSTIC STUDIES: Oxygen Saturation is 95% on RA, adequate by my interpretation.    COORDINATION OF CARE: 11:21 PM- Pt advised of plan for treatment and pt agrees.    Labs  Review Labs Reviewed - No data to display  Imaging Review No results found. Charlann Lange, PA-C has personally reviewed and evaluated these images and lab results as part of her medical decision-making.   EKG Interpretation None      MDM   Final diagnoses:  None    1. Thigh pain, right anterior  No suspicion for DVT, infection. Suspect muscular strain injury requiring supportive care.  I personally performed the services described in this documentation, which was scribed in my presence. The recorded information has been reviewed and is accurate.     Charlann Lange, PA-C 01/02/15 0103  Leo Grosser, MD 01/06/15 [id number]

## 2014-12-31 NOTE — Discharge Instructions (Signed)
Muscle Strain °A muscle strain is an injury that occurs when a muscle is stretched beyond its normal length. Usually a small number of muscle fibers are torn when this happens. Muscle strain is rated in degrees. First-degree strains have the least amount of muscle fiber tearing and pain. Second-degree and third-degree strains have increasingly more tearing and pain.  °Usually, recovery from muscle strain takes 1-2 weeks. Complete healing takes 5-6 weeks.  °CAUSES  °Muscle strain happens when a sudden, violent force placed on a muscle stretches it too far. This may occur with lifting, sports, or a fall.  °RISK FACTORS °Muscle strain is especially common in athletes.  °SIGNS AND SYMPTOMS °At the site of the muscle strain, there may be: °· Pain. °· Bruising. °· Swelling. °· Difficulty using the muscle due to pain or lack of normal function. °DIAGNOSIS  °Your health care provider will perform a physical exam and ask about your medical history. °TREATMENT  °Often, the best treatment for a muscle strain is resting, icing, and applying cold compresses to the injured area.   °HOME CARE INSTRUCTIONS  °· Use the PRICE method of treatment to promote muscle healing during the first 2-3 days after your injury. The PRICE method involves: °· Protecting the muscle from being injured again. °· Restricting your activity and resting the injured body part. °· Icing your injury. To do this, put ice in a plastic bag. Place a towel between your skin and the bag. Then, apply the ice and leave it on from 15-20 minutes each hour. After the third day, switch to moist heat packs. °· Apply compression to the injured area with a splint or elastic bandage. Be careful not to wrap it too tightly. This may interfere with blood circulation or increase swelling. °· Elevate the injured body part above the level of your heart as often as you can. °· Only take over-the-counter or prescription medicines for pain, discomfort, or fever as directed by your  health care provider. °· Warming up prior to exercise helps to prevent future muscle strains. °SEEK MEDICAL CARE IF:  °· You have increasing pain or swelling in the injured area. °· You have numbness, tingling, or a significant loss of strength in the injured area. °MAKE SURE YOU:  °· Understand these instructions. °· Will watch your condition. °· Will get help right away if you are not doing well or get worse. °Document Released: 03/22/2005 Document Revised: 01/10/2013 Document Reviewed: 10/19/2012 °ExitCare® Patient Information ©2015 [employer]. This information is not intended to replace advice given to you by your health care provider. Make sure you discuss any questions you have with your health care provider. ° ° °Heat Therapy °Heat therapy can help ease sore, stiff, injured, and tight muscles and joints. Heat relaxes your muscles, which may help ease your pain.  °RISKS AND COMPLICATIONS °If you have any of the following conditions, do not use heat therapy unless your health care provider has approved: °· Poor circulation. °· Healing wounds or scarred skin in the area being treated. °· Diabetes, heart disease, or high blood pressure. °· Not being able to feel (numbness) the area being treated. °· Unusual swelling of the area being treated. °· Active infections. °· Blood clots. °· Cancer. °· Inability to communicate pain. This may include young children and people who have problems with their brain function (dementia). °· Pregnancy. °Heat therapy should only be used on old, pre-existing, or long-lasting (chronic) injuries. Do not use heat therapy on new injuries unless directed by   your health care provider. °HOW TO USE HEAT THERAPY °There are several different kinds of heat therapy, including: °· Moist heat pack. °· Warm water bath. °· Hot water bottle. °· Electric heating pad. °· Heated gel pack. °· Heated wrap. °· Electric heating pad. °Use the heat therapy method suggested by your health care provider.  Follow your health care provider's instructions on when and how to use heat therapy. °GENERAL HEAT THERAPY RECOMMENDATIONS °· Do not sleep while using heat therapy. Only use heat therapy while you are awake. °· Your skin may turn pink while using heat therapy. Do not use heat therapy if your skin turns red. °· Do not use heat therapy if you have new pain. °· High heat or long exposure to heat can cause burns. Be careful when using heat therapy to avoid burning your skin. °· Do not use heat therapy on areas of your skin that are already irritated, such as with a rash or sunburn. °SEEK MEDICAL CARE IF: °· You have blisters, redness, swelling, or numbness. °· You have new pain. °· Your pain is worse. °MAKE SURE YOU: °· Understand these instructions. °· Will watch your condition. °· Will get help right away if you are not doing well or get worse. °Document Released: 06/14/2011 Document Revised: 08/06/2013 Document Reviewed: 05/15/2013 °ExitCare® Patient Information ©2015 [employer]. This information is not intended to replace advice given to you by your health care provider. Make sure you discuss any questions you have with your health care provider. ° °

## 2015-08-13 DIAGNOSIS — H16143 Punctate keratitis, bilateral: Secondary | ICD-10-CM | POA: Diagnosis not present

## 2015-08-13 DIAGNOSIS — H04123 Dry eye syndrome of bilateral lacrimal glands: Secondary | ICD-10-CM | POA: Diagnosis not present

## 2015-08-13 DIAGNOSIS — H35371 Puckering of macula, right eye: Secondary | ICD-10-CM | POA: Diagnosis not present

## 2015-09-10 DIAGNOSIS — E7800 Pure hypercholesterolemia, unspecified: Secondary | ICD-10-CM | POA: Diagnosis not present

## 2015-09-10 DIAGNOSIS — I10 Essential (primary) hypertension: Secondary | ICD-10-CM | POA: Diagnosis not present

## 2015-09-10 DIAGNOSIS — N3281 Overactive bladder: Secondary | ICD-10-CM | POA: Diagnosis not present

## 2015-09-27 DIAGNOSIS — N390 Urinary tract infection, site not specified: Secondary | ICD-10-CM | POA: Diagnosis not present

## 2015-10-29 ENCOUNTER — Emergency Department (HOSPITAL_COMMUNITY): Payer: Medicare Other

## 2015-10-29 ENCOUNTER — Observation Stay (HOSPITAL_COMMUNITY)
Admission: EM | Admit: 2015-10-29 | Discharge: 2015-10-30 | Disposition: A | Discharge status: Home / Self Care | Payer: Medicare Other | Attending: Internal Medicine | Admitting: Internal Medicine

## 2015-10-29 ENCOUNTER — Encounter (HOSPITAL_COMMUNITY): Payer: Self-pay | Admitting: Emergency Medicine

## 2015-10-29 DIAGNOSIS — E785 Hyperlipidemia, unspecified: Secondary | ICD-10-CM | POA: Diagnosis not present

## 2015-10-29 DIAGNOSIS — R51 Headache: Secondary | ICD-10-CM | POA: Diagnosis not present

## 2015-10-29 DIAGNOSIS — G4700 Insomnia, unspecified: Secondary | ICD-10-CM | POA: Diagnosis not present

## 2015-10-29 DIAGNOSIS — R001 Bradycardia, unspecified: Secondary | ICD-10-CM | POA: Diagnosis present

## 2015-10-29 DIAGNOSIS — Z79899 Other long term (current) drug therapy: Secondary | ICD-10-CM | POA: Diagnosis not present

## 2015-10-29 DIAGNOSIS — Z6836 Body mass index (BMI) 36.0-36.9, adult: Secondary | ICD-10-CM | POA: Insufficient documentation

## 2015-10-29 DIAGNOSIS — R519 Headache, unspecified: Secondary | ICD-10-CM

## 2015-10-29 DIAGNOSIS — N3281 Overactive bladder: Secondary | ICD-10-CM | POA: Diagnosis not present

## 2015-10-29 DIAGNOSIS — G459 Transient cerebral ischemic attack, unspecified: Principal | ICD-10-CM | POA: Diagnosis present

## 2015-10-29 DIAGNOSIS — I10 Essential (primary) hypertension: Secondary | ICD-10-CM | POA: Diagnosis not present

## 2015-10-29 DIAGNOSIS — E669 Obesity, unspecified: Secondary | ICD-10-CM | POA: Diagnosis not present

## 2015-10-29 DIAGNOSIS — R42 Dizziness and giddiness: Secondary | ICD-10-CM

## 2015-10-29 DIAGNOSIS — Z7982 Long term (current) use of aspirin: Secondary | ICD-10-CM | POA: Insufficient documentation

## 2015-10-29 HISTORY — DX: Cardiac murmur, unspecified: R01.1

## 2015-10-29 LAB — CBC WITH DIFFERENTIAL/PLATELET
Basophils Absolute: 0 10*3/uL (ref 0.0–0.1)
Basophils Relative: 0 %
Eosinophils Absolute: 0.2 10*3/uL (ref 0.0–0.7)
Eosinophils Relative: 2 %
HCT: 39.2 % (ref 36.0–46.0)
Hemoglobin: 13.4 g/dL (ref 12.0–15.0)
Lymphocytes Relative: 36 %
Lymphs Abs: 2.7 10*3/uL (ref 0.7–4.0)
MCH: 32.6 pg (ref 26.0–34.0)
MCHC: 34.2 g/dL (ref 30.0–36.0)
MCV: 95.4 fL (ref 78.0–100.0)
Monocytes Absolute: 0.5 10*3/uL (ref 0.1–1.0)
Monocytes Relative: 7 %
Neutro Abs: 4.2 10*3/uL (ref 1.7–7.7)
Neutrophils Relative %: 55 %
Platelets: 268 10*3/uL (ref 150–400)
RBC: 4.11 MIL/uL (ref 3.87–5.11)
RDW: 12.1 % (ref 11.5–15.5)
WBC: 7.6 10*3/uL (ref 4.0–10.5)

## 2015-10-29 LAB — URINALYSIS, ROUTINE W REFLEX MICROSCOPIC
Bilirubin Urine: NEGATIVE
Glucose, UA: NEGATIVE mg/dL
Hgb urine dipstick: NEGATIVE
Ketones, ur: NEGATIVE mg/dL
Leukocytes, UA: NEGATIVE
Nitrite: NEGATIVE
Protein, ur: NEGATIVE mg/dL
Specific Gravity, Urine: 1.026 (ref 1.005–1.030)
pH: 5.5 (ref 5.0–8.0)

## 2015-10-29 MED ORDER — ASPIRIN 81 MG PO CHEW
324.0000 mg | CHEWABLE_TABLET | Freq: Once | ORAL | Status: AC
  Administered 2015-10-29: 324 mg via ORAL
  Filled 2015-10-29: qty 4

## 2015-10-29 NOTE — ED Provider Notes (Signed)
[facility] DEPT Provider Note   CSN: [id number] Arrival date & time: 10/29/15  [id number]  First Provider Contact:  None       History   Chief Complaint Chief Complaint  Patient presents with  . Dizziness  . Headache    HPI Linda Harvey is a 65 y.o. female.  The history is provided by the patient.  Dizziness    Headache  Presenting symptoms: headache   65 year old female with history of hypertension and hyperlipidemia comes in because of shooting pains in the right side of her head. Pain is only in the parietal region, no pain in the occipital or frontal regions. These have been going on for the last several weeks. Pain does feel like a bolt of lightening. They're there for only a second or 2 before going away. She has had more pains today than she had been having previously. They come on without any particular pattern. She denies any visual change, nausea, vomiting, weakness. Also, earlier today she was walking around a track and she had 3 episodes where she was dizzy. She describes that she was walking straight stylet she was going sideways. He should these episodes last about 30 seconds before resolving. She has had no further episodes. There's been no nausea or vomiting. She does take a daily aspirin.  Past Medical History:  Diagnosis Date  . Essential hypertension   . Hyperlipidemia   . Insomnia   . Obesity (BMI 35.0-39.9 without comorbidity) ([place])   . Overactive bladder   . Severe Acute Febrile Illness as Child     Patient Active Problem List   Diagnosis Date Noted  . Chest pain with moderate risk for cardiac etiology 06/14/2014  . DOE (dyspnea on exertion) 06/14/2014  . Difficulty sleeping 06/14/2014  . Aortic valve disease - Systolic ejection murmur [id number]  . Essential hypertension 06/14/2014  . Hyperlipidemia with target LDL less than 100 06/14/2014    Past Surgical History:  Procedure Laterality Date  . NM MYOVIEW LTD  06/25/14   Low Risk ST - normal  perfusion, abnormal GXT, EF ~70%  . TRANSTHORACIC ECHOCARDIOGRAM  06/25/14    LV size & function - EF 55-60%, Gr 1 DD, mild-mod TR, Tr MR; Mild Aortic Sclerosis  . TUBAL LIGATION Bilateral     OB History    No data available       Home Medications    Prior to Admission medications   Medication Sig Start Date End Date Taking? Authorizing Provider  aspirin EC 81 MG tablet Take 81 mg by mouth daily.   Yes Historical Provider, MD  aspirin-acetaminophen-caffeine (EXCEDRIN MIGRAINE) [phone] MG tablet Take 2 tablets by mouth every 6 (six) hours as needed for headache.   Yes Historical Provider, MD  calcium carbonate (OS-CAL - DOSED IN MG OF ELEMENTAL CALCIUM) 1250 (500 Ca) MG tablet Take 1 tablet by mouth daily with breakfast.   Yes Historical Provider, MD  cholecalciferol (VITAMIN D) 1000 units tablet Take 1,000 Units by mouth daily.   Yes Historical Provider, MD  diphenhydramine-acetaminophen (TYLENOL PM) 25-500 MG TABS tablet Take 1 tablet by mouth at bedtime as needed (sleep).   Yes Historical Provider, MD  lisinopril-hydrochlorothiazide (PRINZIDE,ZESTORETIC) 20-25 MG per tablet Take 1 tablet by mouth daily. 07/11/14  Yes Historical Provider, MD  MELATONIN PO Take 1 tablet by mouth daily.    Yes Historical Provider, MD  Multiple Vitamin (MULTIVITAMIN WITH MINERALS) TABS tablet Take 1 tablet by mouth daily.   Yes Historical Provider, MD  NITROSTAT 0.4 MG SL tablet Place 0.4 mg under the tongue every 5 (five) minutes as needed.  06/11/14  Yes Historical Provider, MD  oxybutynin (DITROPAN-XL) 5 MG 24 hr tablet Take 5 mg by mouth at bedtime.   Yes Historical Provider, MD  simvastatin (ZOCOR) 20 MG tablet Take 20 mg by mouth daily.   Yes Historical Provider, MD  vitamin B-12 (CYANOCOBALAMIN) 1000 MCG tablet Take 1,000 mcg by mouth daily.   Yes Historical Provider, MD  cyclobenzaprine (FLEXERIL) 5 MG tablet Take 1 tablet (5 mg total) by mouth 3 (three) times daily. Patient not taking: Reported on  10/29/2015 12/31/14   Charlann Lange, PA-C    Family History Family History  Problem Relation Age of Onset  . Diabetes Mother   . Cancer Father   . Diabetes Son   . Epilepsy Son   . Diabetes Sister     Social History Social History  Substance Use Topics  . Smoking status: Never Smoker  . Smokeless tobacco: Never Used  . Alcohol use Yes     Comment: occ     Allergies   Review of patient's allergies indicates no known allergies.   Review of Systems Review of Systems  Neurological: Positive for dizziness and headaches.  All other systems reviewed and are negative.    Physical Exam Updated Vital Signs BP 183/88 (BP Location: Right Arm)   Pulse 71   Temp 97.3 F (36.3 C) (Oral)   Resp 16   SpO2 98%   Physical Exam  Nursing note and vitals reviewed.  65 year old female, resting comfortably and in no acute distress. Vital signs are significant for hypertension. Oxygen saturation is 98%, which is normal. Head is normocephalic and atraumatic. PERRLA, EOMI. Oropharynx is clear. There is no scalp tenderness. No scalp lesions are seen. Neck is nontender and supple without adenopathy or JVD. There are no carotid bruits. Back is nontender and there is no CVA tenderness. Lungs are clear without rales, wheezes, or rhonchi. Chest is nontender. Heart has regular rate and rhythm without murmur. Abdomen is soft, flat, nontender without masses or hepatosplenomegaly and peristalsis is normoactive. Extremities have 1+ edema, full range of motion is present. Skin is warm and dry without rash. Neurologic: Mental status is normal, cranial nerves are intact, there are no motor or sensory deficits. Finger to nose testing is normal. There is no pronator drift. Romberg is negative.   ED Treatments / Results  Labs (all labs ordered are listed, but only abnormal results are displayed) Labs Reviewed  COMPREHENSIVE METABOLIC PANEL - Abnormal; Notable for the following:       Result Value     Glucose, Bld 108 (*)    BUN 28 (*)    Creatinine, Ser 1.09 (*)    GFR calc non Af Amer 52 (*)    GFR calc Af Amer 60 (*)    All other components within normal limits  TROPONIN I  CBC WITH DIFFERENTIAL/PLATELET  URINALYSIS, ROUTINE W REFLEX MICROSCOPIC (NOT AT [facility])    EKG  EKG Interpretation  Date/Time:  Wednesday October 29 2015 23:25:30 EDT Ventricular Rate:  57 PR Interval:    QRS Duration: 110 QT Interval:  446 QTC Calculation: 435 R Axis:   -7 Text Interpretation:  Sinus rhythm Low voltage, precordial leads Abnormal R-wave progression, early transition Probable left ventricular hypertrophy No old tracing to compare Confirmed by [facility]  MD, Mikela Senn ([id number]) on 10/29/2015 11:44:50 PM       Radiology Ct  Head Wo Contrast  Result Date: 10/30/2015 CLINICAL DATA:  Shooting pain to the right side of the head and dizziness for the past 3-4 days. EXAM: CT HEAD WITHOUT CONTRAST CT CERVICAL SPINE WITHOUT CONTRAST TECHNIQUE: Multidetector CT imaging of the head and cervical spine was performed following the standard protocol without intravenous contrast. Multiplanar CT image reconstructions of the cervical spine were also generated. COMPARISON:  None. FINDINGS: CT HEAD FINDINGS Gray-white differentiation is maintained. No CT evidence of acute large territory infarct. No intraparenchymal or extra-axial mass or hemorrhage. Normal size and configuration the ventricles and basilar cisterns. No midline shift. Minimal intracranial atherosclerosis. Limited visualization the paranasal sinuses and mastoid air cells is normal. No air-fluid levels. Regional soft tissues appear normal. No displaced calvarial fracture. CT CERVICAL SPINE FINDINGS C1 to the superior endplate of T3 is imaged. There is straightening and slight reversal the expected cervical lordosis with mild kyphosis centered about the C4-C5 articulation. No anterolisthesis or retrolisthesis. The bilateral facets are normally aligned. The dens is  normally positioned and a lateral masses of C1. Normal atlantodental and atlantoaxial articulations. No fracture or static subluxation of the cervical spine. Cervical vertebral body heights are preserved. Prevertebral soft tissues are normal. Mild to moderate multilevel cervical spine DDD, likely worse at C4-C5 and C5-C6 with disc space height loss, endplate irregularity and small posteriorly directed disc osteophyte complexes at these locations. Calcified atherosclerotic plaque within the right carotid bulb. No bulky cervical lymphadenopathy on this noncontrast examination. Normal noncontrast appearance of the thyroid gland. Limited visualization of the lung apices is normal. IMPRESSION: 1. Negative noncontrast head CT. 2. No fracture or static subluxation of the cervical spine. 3. Mild straightening and slight reversal of the expected cervical lordosis, nonspecific though could be seen in the setting of muscle spasm. 4. Mild-to-moderate multilevel cervical spine DDD. Electronically Signed   By: Sandi Mariscal M.D.   On: 10/30/2015 00:06  Ct Cervical Spine Wo Contrast  Result Date: 10/30/2015 CLINICAL DATA:  Shooting pain to the right side of the head and dizziness for the past 3-4 days. EXAM: CT HEAD WITHOUT CONTRAST CT CERVICAL SPINE WITHOUT CONTRAST TECHNIQUE: Multidetector CT imaging of the head and cervical spine was performed following the standard protocol without intravenous contrast. Multiplanar CT image reconstructions of the cervical spine were also generated. COMPARISON:  None. FINDINGS: CT HEAD FINDINGS Gray-white differentiation is maintained. No CT evidence of acute large territory infarct. No intraparenchymal or extra-axial mass or hemorrhage. Normal size and configuration the ventricles and basilar cisterns. No midline shift. Minimal intracranial atherosclerosis. Limited visualization the paranasal sinuses and mastoid air cells is normal. No air-fluid levels. Regional soft tissues appear normal.  No displaced calvarial fracture. CT CERVICAL SPINE FINDINGS C1 to the superior endplate of T3 is imaged. There is straightening and slight reversal the expected cervical lordosis with mild kyphosis centered about the C4-C5 articulation. No anterolisthesis or retrolisthesis. The bilateral facets are normally aligned. The dens is normally positioned and a lateral masses of C1. Normal atlantodental and atlantoaxial articulations. No fracture or static subluxation of the cervical spine. Cervical vertebral body heights are preserved. Prevertebral soft tissues are normal. Mild to moderate multilevel cervical spine DDD, likely worse at C4-C5 and C5-C6 with disc space height loss, endplate irregularity and small posteriorly directed disc osteophyte complexes at these locations. Calcified atherosclerotic plaque within the right carotid bulb. No bulky cervical lymphadenopathy on this noncontrast examination. Normal noncontrast appearance of the thyroid gland. Limited visualization of the lung apices is normal. IMPRESSION:  1. Negative noncontrast head CT. 2. No fracture or static subluxation of the cervical spine. 3. Mild straightening and slight reversal of the expected cervical lordosis, nonspecific though could be seen in the setting of muscle spasm. 4. Mild-to-moderate multilevel cervical spine DDD. Electronically Signed   By: Sandi Mariscal M.D.   On: 10/30/2015 00:06   Procedures Procedures (including critical care time)  Medications Ordered in ED Medications - No data to display   Initial Impression / Assessment and Plan / ED Course  I have reviewed the triage vital signs and the nursing notes.  Pertinent labs & imaging results that were available during my care of the patient were reviewed by me and considered in my medical decision making (see chart for details).  Clinical Course    Shooting pains in the right side of the head. This is suspicious for irritation of a cervical nerve root. Dizziness is  certainly worrisome for central vertigo. Exam shows no signs of cerebellar dysfunction currently, but her symptoms had resolved. This probably should be treated as a transient ischemic attack. Workup is initiated with CBC, metabolic panel, ECG, CT of head and cervical spine. Review old records shows she had cardiac evaluation last year including a negative nuclear stress test.  CT showed degenerative changes in cervical spine, no acute changes in the head. Case is discussed with Dr. Hal Hope of [facility] who agrees to admit the patient. However, she will need to be transferred to [facility] for her TIA evaluation.  Final Clinical Impressions(s) / ED Diagnoses   Final diagnoses:  Nonintractable headache, unspecified chronicity pattern, unspecified headache type  Dizziness    New Prescriptions New Prescriptions   No medications on file     Delora Fuel, MD [id number]

## 2015-10-29 NOTE — ED Triage Notes (Signed)
Pt states for the past 3-4 days she has had shooting pains in the right side of her head that feel like "bolts of lightening" Pt states today she has been dizzy   Pt states she went walking this morning on a track that was level but she felt like it was going up and down  No neuro deficits noted upon exam

## 2015-10-30 ENCOUNTER — Encounter (HOSPITAL_COMMUNITY): Payer: Self-pay | Admitting: Internal Medicine

## 2015-10-30 ENCOUNTER — Observation Stay (HOSPITAL_COMMUNITY): Payer: Medicare Other

## 2015-10-30 DIAGNOSIS — R51 Headache: Secondary | ICD-10-CM

## 2015-10-30 DIAGNOSIS — R519 Headache, unspecified: Secondary | ICD-10-CM

## 2015-10-30 DIAGNOSIS — G459 Transient cerebral ischemic attack, unspecified: Secondary | ICD-10-CM | POA: Diagnosis not present

## 2015-10-30 DIAGNOSIS — I10 Essential (primary) hypertension: Secondary | ICD-10-CM

## 2015-10-30 DIAGNOSIS — R001 Bradycardia, unspecified: Secondary | ICD-10-CM

## 2015-10-30 DIAGNOSIS — I498 Other specified cardiac arrhythmias: Secondary | ICD-10-CM

## 2015-10-30 DIAGNOSIS — G458 Other transient cerebral ischemic attacks and related syndromes: Secondary | ICD-10-CM

## 2015-10-30 DIAGNOSIS — R42 Dizziness and giddiness: Secondary | ICD-10-CM

## 2015-10-30 LAB — COMPREHENSIVE METABOLIC PANEL
ALT: 30 U/L (ref 14–54)
ALT: 28 U/L (ref 14–54)
AST: 34 U/L (ref 15–41)
AST: 31 U/L (ref 15–41)
Albumin: 4.4 g/dL (ref 3.5–5.0)
Albumin: 3.7 g/dL (ref 3.5–5.0)
Alkaline Phosphatase: 67 U/L (ref 38–126)
Alkaline Phosphatase: 56 U/L (ref 38–126)
Anion gap: 6 (ref 5–15)
Anion gap: 9 (ref 5–15)
BUN: 28 mg/dL — ABNORMAL HIGH (ref 6–20)
BUN: 21 mg/dL — ABNORMAL HIGH (ref 6–20)
CO2: 26 mmol/L (ref 22–32)
CO2: 24 mmol/L (ref 22–32)
Calcium: 9.3 mg/dL (ref 8.9–10.3)
Calcium: 8.9 mg/dL (ref 8.9–10.3)
Chloride: 108 mmol/L (ref 101–111)
Chloride: 105 mmol/L (ref 101–111)
Creatinine, Ser: 1.09 mg/dL — ABNORMAL HIGH (ref 0.44–1.00)
Creatinine, Ser: 0.92 mg/dL (ref 0.44–1.00)
GFR calc Af Amer: 60 mL/min — ABNORMAL LOW (ref >60)
GFR calc Af Amer: >60 mL/min (ref >60)
GFR calc non Af Amer: 52 mL/min — ABNORMAL LOW (ref >60)
GFR calc non Af Amer: >60 mL/min (ref >60)
Glucose, Bld: 108 mg/dL — ABNORMAL HIGH (ref 65–99)
Glucose, Bld: 107 mg/dL — ABNORMAL HIGH (ref 65–99)
Potassium: 4.2 mmol/L (ref 3.5–5.1)
Potassium: 3.7 mmol/L (ref 3.5–5.1)
Sodium: 140 mmol/L (ref 135–145)
Sodium: 138 mmol/L (ref 135–145)
Total Bilirubin: 0.5 mg/dL (ref 0.3–1.2)
Total Bilirubin: 0.7 mg/dL (ref 0.3–1.2)
Total Protein: 7.3 g/dL (ref 6.5–8.1)
Total Protein: 6.2 g/dL — ABNORMAL LOW (ref 6.5–8.1)

## 2015-10-30 LAB — CBC
HCT: 35.6 % — ABNORMAL LOW (ref 36.0–46.0)
Hemoglobin: 12.3 g/dL (ref 12.0–15.0)
MCH: 32.5 pg (ref 26.0–34.0)
MCHC: 34.6 g/dL (ref 30.0–36.0)
MCV: 94.2 fL (ref 78.0–100.0)
Platelets: 251 10*3/uL (ref 150–400)
RBC: 3.78 MIL/uL — ABNORMAL LOW (ref 3.87–5.11)
RDW: 12.2 % (ref 11.5–15.5)
WBC: 5.5 10*3/uL (ref 4.0–10.5)

## 2015-10-30 LAB — TROPONIN I: Troponin I: <0.03 ng/mL (ref <0.03)

## 2015-10-30 LAB — LIPID PANEL
Cholesterol: 177 mg/dL (ref 0–200)
HDL: 51 mg/dL (ref >40)
LDL Cholesterol: 115 mg/dL — ABNORMAL HIGH (ref 0–99)
Total CHOL/HDL Ratio: 3.5 RATIO
Triglycerides: 53 mg/dL (ref <150)
VLDL: 11 mg/dL (ref 0–40)

## 2015-10-30 LAB — SEDIMENTATION RATE: Sed Rate: 22 mm/hr (ref 0–22)

## 2015-10-30 LAB — TSH: TSH: 2.997 u[IU]/mL (ref 0.350–4.500)

## 2015-10-30 MED ORDER — LORAZEPAM 2 MG/ML IJ SOLN
1.0000 mg | Freq: Once | INTRAMUSCULAR | Status: AC
  Administered 2015-10-30: 1 mg via INTRAVENOUS

## 2015-10-30 MED ORDER — ENOXAPARIN SODIUM 40 MG/0.4ML ~~LOC~~ SOLN
40.0000 mg | SUBCUTANEOUS | Status: DC
  Administered 2015-10-30: 40 mg via SUBCUTANEOUS
  Filled 2015-10-30: qty 0.4

## 2015-10-30 MED ORDER — NITROGLYCERIN 0.4 MG SL SUBL
0.4000 mg | SUBLINGUAL_TABLET | SUBLINGUAL | Status: DC | PRN
Start: 2015-10-30 — End: 2015-10-30

## 2015-10-30 MED ORDER — LISINOPRIL 20 MG PO TABS
20.0000 mg | ORAL_TABLET | Freq: Every day | ORAL | Status: DC
  Administered 2015-10-30: 20 mg via ORAL
  Filled 2015-10-30: qty 1

## 2015-10-30 MED ORDER — LORAZEPAM 2 MG/ML IJ SOLN
INTRAMUSCULAR | Status: AC
  Filled 2015-10-30: qty 1

## 2015-10-30 MED ORDER — ASPIRIN 300 MG RE SUPP: 300.0000 mg | Freq: Every day | RECTAL | Status: DC

## 2015-10-30 MED ORDER — SODIUM CHLORIDE 0.9 % IV SOLN: INTRAVENOUS | Status: DC

## 2015-10-30 MED ORDER — DIPHENHYDRAMINE-APAP (SLEEP) 25-500 MG PO TABS: 1.0000 | ORAL_TABLET | Freq: Every evening | ORAL | Status: DC | PRN

## 2015-10-30 MED ORDER — ACETAMINOPHEN 500 MG PO TABS: 500.0000 mg | ORAL_TABLET | Freq: Every evening | ORAL | Status: DC | PRN

## 2015-10-30 MED ORDER — DIPHENHYDRAMINE HCL 25 MG PO CAPS: 25.0000 mg | ORAL_CAPSULE | Freq: Every evening | ORAL | Status: DC | PRN

## 2015-10-30 MED ORDER — ASPIRIN 325 MG PO TABS
325.0000 mg | ORAL_TABLET | Freq: Every day | ORAL | Status: DC
  Administered 2015-10-30: 325 mg via ORAL
  Filled 2015-10-30: qty 1

## 2015-10-30 MED ORDER — STROKE: EARLY STAGES OF RECOVERY BOOK: Freq: Once | Status: DC

## 2015-10-30 MED ORDER — OXYBUTYNIN CHLORIDE ER 5 MG PO TB24: 5.0000 mg | ORAL_TABLET | Freq: Every day | ORAL | Status: DC

## 2015-10-30 MED ORDER — SIMVASTATIN 20 MG PO TABS: 20.0000 mg | ORAL_TABLET | Freq: Every day | ORAL | Status: DC

## 2015-10-30 MED ORDER — VITAMIN B-12 1000 MCG PO TABS
1000.0000 ug | ORAL_TABLET | Freq: Every day | ORAL | Status: DC
  Administered 2015-10-30: 1000 ug via ORAL
  Filled 2015-10-30: qty 1

## 2015-10-30 NOTE — Progress Notes (Signed)
Subjective:  Linda Harvey is a pleasant 65 year old patient who presents with a history of transient dizziness and vertigo that has been present for a few weeks. She reports that when she walks she feels that she veers slightly to the left. Her neurological exam was otherwise unremarkable.  Linda Harvey is already undergone her MRI of the brain. There is no evidence of any ischemic change. She was noted to have a small 3 mm pituitary cyst. This appears to be an incidental finding.  Exam: Vitals:   10/30/15 0800 10/30/15 1000  BP: 123/69 134/77  Pulse: (!) 56 62  Resp: 16 18  Temp: 97.9 F (36.6 C) 97.7 F (36.5 C)    HEENT-  Normocephalic, no lesions, without obvious abnormality.  Normal external eye and conjunctiva.  Normal TM's bilaterally.  Normal auditory canals and external ears. Normal external nose, mucus membranes and septum.  Normal pharynx. Cardiovascular- regular rate and rhythm, S1, S2 normal, no murmur, click, rub or gallop, pulses palpable throughout   Lungs- chest clear, no wheezing, rales, normal symmetric air entry, Heart exam - S1, S2 normal, no murmur, no gallop, rate regular Abdomen- soft, non-tender; bowel sounds normal; no masses,  no organomegaly Extremities- less then 2 second capillary refill Lymph-no adenopathy palpable Musculoskeletal-no joint tenderness, deformity or swelling Skin-warm and dry, no hyperpigmentation, vitiligo, or suspicious lesions    Gen: In bed, NAD MS: Alert and oriented CN: Cranial nerves II through XII are intact. Motor: Motor exam is normal. Sensory: Sensation is intact. DTR: Reflexes are 1-2+ throughout.  Pertinent Labs/Diagnostics: Reviewed    Impression:   Linda Harvey is a pleasant 65 year old patient who presents with a history of intermittent vertiginous sensations. There is no evidence of ischemic change on her MRI. This may simply represent vestibular dysfunction. The meclizine and vestibular rehabilitation training may be of some  additional benefit.  The MRI revealed the presence of an incidental 3 mm pituitary cyst. This can be further imaged on an outpatient basis with dedicated MRI with fine cuts through the pituitary gland. Further evaluation by the endocrinology service for any evidence of endocrine dysfunction may also be of some benefit.   Recommendations:  1. Consider discharge with meclizine and outpatient vestibular rehabilitation. It  2. Outpatient follow-up MRI with fine cuts through the pituitary gland and also consider endocrinology referral.   Gerda Diss. Tasia Catchings, M.D. [facility] Phone: [phone]   10/30/2015, 10:28 AM

## 2015-10-30 NOTE — Consult Note (Signed)
Admission H&P    Chief Complaint: Transient disequilibrium and headaches.  HPI: Linda Harvey is an 65 y.o. female history of hypertension, hyperlipidemia and overactive bladder who presented to the ED at [facility] following several episodes of disequilibrium while walking. Patient felt as if she was veering to her left side uncontrollably, in addition to increasing frequency of shooting pains involving the right side of her head. She had no associated speech changes nor focal weakness or numbness involving the extremities. CT scan of her head showed no acute intracranial abnormality. 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 She was admitted for workup for possible TIA. Initial evaluation in the ED was unremarkable except for mild bradycardia.  Past Medical History:  Diagnosis Date  . Essential hypertension   . Hyperlipidemia   . Insomnia   . Obesity (BMI 35.0-39.9 without comorbidity) ([place])   . Overactive bladder   . Severe Acute Febrile Illness as Child     Past Surgical History:  Procedure Laterality Date  . NM MYOVIEW LTD  06/25/14   Low Risk ST - normal perfusion, abnormal GXT, EF ~70%  . TRANSTHORACIC ECHOCARDIOGRAM  06/25/14    LV size & function - EF 55-60%, Gr 1 DD, mild-mod TR, Tr MR; Mild Aortic Sclerosis  . TUBAL LIGATION Bilateral     Family History  Problem Relation Age of Onset  . Diabetes Mother   . Cancer Father   . Diabetes Son   . Epilepsy Son   . Diabetes Sister    Social History:  reports that she has never smoked. She has never used smokeless tobacco. She reports that she drinks alcohol. She reports that she does not use drugs.  Allergies: No Known Allergies  Medications Prior to Admission  Medication Sig Dispense Refill  . aspirin EC 81 MG tablet Take 81 mg by mouth daily.    Marland Kitchen  aspirin-acetaminophen-caffeine (EXCEDRIN MIGRAINE) 250-250-65 MG tablet Take 2 tablets by mouth every 6 (six) hours as needed for headache.    . calcium carbonate (OS-CAL - DOSED IN MG OF ELEMENTAL CALCIUM) 1250 (500 Ca) MG tablet Take 1 tablet by mouth daily with breakfast.    . cholecalciferol (VITAMIN D) 1000 units tablet Take 1,000 Units by mouth daily.    . diphenhydramine-acetaminophen (TYLENOL PM) 25-500 MG TABS tablet Take 1 tablet by mouth at bedtime as needed (sleep).    Marland Kitchen lisinopril-hydrochlorothiazide (PRINZIDE,ZESTORETIC) 20-25 MG per tablet Take 1 tablet by mouth daily.    Marland Kitchen MELATONIN PO Take 1 tablet by mouth daily.     . Multiple Vitamin (MULTIVITAMIN WITH MINERALS) TABS tablet Take 1 tablet by mouth daily.    Marland Kitchen NITROSTAT 0.4 MG SL tablet Place 0.4 mg under the tongue every 5 (five) minutes as needed.     Marland Kitchen oxybutynin (DITROPAN-XL) 5 MG 24 hr tablet Take 5 mg by mouth at bedtime.    . simvastatin (ZOCOR) 20 MG tablet Take 20 mg by mouth daily.    . vitamin B-12 (CYANOCOBALAMIN) 1000 MCG tablet Take 1,000 mcg by mouth daily.    . cyclobenzaprine (FLEXERIL) 5 MG tablet Take 1 tablet (5 mg total) by mouth 3 (three) times daily. (Patient not taking: Reported on 10/29/2015) 15 tablet 0    ROS: History obtained from chart review and the patient  General ROS: negative for - chills, fatigue, fever, night  sweats, weight gain or weight loss Psychological ROS: negative for - behavioral disorder, hallucinations, memory difficulties, mood swings or suicidal ideation Ophthalmic ROS: negative for - blurry vision, double vision, eye pain or loss of vision ENT ROS: negative for - epistaxis, nasal discharge, oral lesions, sore throat, tinnitus or vertigo Allergy and Immunology ROS: negative for - hives or itchy/watery eyes Hematological and Lymphatic ROS: negative for - bleeding problems, bruising or swollen lymph nodes Endocrine ROS: negative for - galactorrhea, hair pattern changes,  polydipsia/polyuria or temperature intolerance Respiratory ROS: negative for - cough, hemoptysis, shortness of breath or wheezing Cardiovascular ROS: negative for - chest pain, dyspnea on exertion, edema or irregular heartbeat Gastrointestinal ROS: negative for - abdominal pain, diarrhea, hematemesis, nausea/vomiting or stool incontinence Genito-Urinary ROS: negative for - dysuria, hematuria, incontinence or urinary frequency/urgency Musculoskeletal ROS: negative for - joint swelling or muscular weakness Neurological ROS: as noted in HPI Dermatological ROS: negative for rash and skin lesion changes  Physical Examination: Blood pressure (!) 159/69, pulse (!) 55, temperature 97.7 F (36.5 C), temperature source Oral, resp. rate 18, height 5' 4"  (1.626 m), weight 95.3 kg (210 lb 1.6 oz), SpO2 98 %.  HEENT-  Normocephalic, no lesions, without obvious abnormality.  Normal external eye and conjunctiva.  Normal TM's bilaterally.  Normal auditory canals and external ears. Normal external nose, mucus membranes and septum.  Normal pharynx. Neck supple with no masses, nodes, nodules or enlargement. Cardiovascular - mild bradycardia, normal rhythm, S1 and S2, no murmur or gallop Lungs - chest clear, no wheezing, rales, normal symmetric air entry Abdomen - soft, non-tender; bowel sounds normal; no masses,  no organomegaly Extremities - no joint deformities, effusion, or inflammation  Neurologic Examination: Mental Status: Alert, oriented, thought content appropriate.  Speech fluent without evidence of aphasia. Able to follow commands without difficulty. Cranial Nerves: II-Visual fields were normal. III/IV/VI-Pupils were equal and reacted normally to light. Extraocular movements were full and conjugate, with no nystagmus.    V/VII-no facial numbness and no facial weakness. VIII-normal. X-normal speech and symmetrical palatal movement. XI: trapezius strength/neck flexion strength normal  bilaterally XII-midline tongue extension with normal strength. Motor: 5/5 bilaterally with normal tone and bulk Sensory: Normal throughout. Deep Tendon Reflexes: 1+ and symmetric. Plantars: Flexor bilaterally Cerebellar: Normal finger-to-nose testing. Carotid auscultation: Normal  Results for orders placed or performed during the hospital encounter of 10/29/15 (from the past 48 hour(s))  Troponin I     Status: None   Collection Time: 10/29/15 11:26 PM  Result Value Ref Range   Troponin I <0.03 <0.03 ng/mL  CBC with Differential     Status: None   Collection Time: 10/29/15 11:26 PM  Result Value Ref Range   WBC 7.6 4.0 - 10.5 K/uL   RBC 4.11 3.87 - 5.11 MIL/uL   Hemoglobin 13.4 12.0 - 15.0 g/dL   HCT 39.2 36.0 - 46.0 %   MCV 95.4 78.0 - 100.0 fL   MCH 32.6 26.0 - 34.0 pg   MCHC 34.2 30.0 - 36.0 g/dL   RDW 12.1 11.5 - 15.5 %   Platelets 268 150 - 400 K/uL   Neutrophils Relative % 55 %   Neutro Abs 4.2 1.7 - 7.7 K/uL   Lymphocytes Relative 36 %   Lymphs Abs 2.7 0.7 - 4.0 K/uL   Monocytes Relative 7 %   Monocytes Absolute 0.5 0.1 - 1.0 K/uL   Eosinophils Relative 2 %   Eosinophils Absolute 0.2 0.0 - 0.7 K/uL   Basophils Relative 0 %  Basophils Absolute 0.0 0.0 - 0.1 K/uL  Comprehensive metabolic panel     Status: Abnormal   Collection Time: 10/29/15 11:26 PM  Result Value Ref Range   Sodium 140 135 - 145 mmol/L   Potassium 4.2 3.5 - 5.1 mmol/L   Chloride 108 101 - 111 mmol/L   CO2 26 22 - 32 mmol/L   Glucose, Bld 108 (H) 65 - 99 mg/dL   BUN 28 (H) 6 - 20 mg/dL   Creatinine, Ser 1.09 (H) 0.44 - 1.00 mg/dL   Calcium 9.3 8.9 - 10.3 mg/dL   Total Protein 7.3 6.5 - 8.1 g/dL   Albumin 4.4 3.5 - 5.0 g/dL   AST 34 15 - 41 U/L   ALT 30 14 - 54 U/L   Alkaline Phosphatase 67 38 - 126 U/L   Total Bilirubin 0.5 0.3 - 1.2 mg/dL   GFR calc non Af Amer 52 (L) >60 mL/min   GFR calc Af Amer 60 (L) >60 mL/min    Comment: (NOTE) The eGFR has been calculated using the CKD EPI  equation. This calculation has not been validated in all clinical situations. eGFR's persistently <60 mL/min signify possible Chronic Kidney Disease.    Anion gap 6 5 - 15  Urinalysis, Routine w reflex microscopic     Status: None   Collection Time: 10/29/15 11:41 PM  Result Value Ref Range   Color, Urine YELLOW YELLOW   APPearance CLEAR CLEAR   Specific Gravity, Urine 1.026 1.005 - 1.030   pH 5.5 5.0 - 8.0   Glucose, UA NEGATIVE NEGATIVE mg/dL   Hgb urine dipstick NEGATIVE NEGATIVE   Bilirubin Urine NEGATIVE NEGATIVE   Ketones, ur NEGATIVE NEGATIVE mg/dL   Protein, ur NEGATIVE NEGATIVE mg/dL   Nitrite NEGATIVE NEGATIVE   Leukocytes, UA NEGATIVE NEGATIVE    Comment: MICROSCOPIC NOT DONE ON URINES WITH NEGATIVE PROTEIN, BLOOD, LEUKOCYTES, NITRITE, OR GLUCOSE <1000 mg/dL.   Ct Head Wo Contrast  Result Date: 10/30/2015 CLINICAL DATA:  Shooting pain to the right side of the head and dizziness for the past 3-4 days. EXAM: CT HEAD WITHOUT CONTRAST CT CERVICAL SPINE WITHOUT CONTRAST TECHNIQUE: Multidetector CT imaging of the head and cervical spine was performed following the standard protocol without intravenous contrast. Multiplanar CT image reconstructions of the cervical spine were also generated. COMPARISON:  None. FINDINGS: CT HEAD FINDINGS Gray-white differentiation is maintained. No CT evidence of acute large territory infarct. No intraparenchymal or extra-axial mass or hemorrhage. Normal size and configuration the ventricles and basilar cisterns. No midline shift. Minimal intracranial atherosclerosis. Limited visualization the paranasal sinuses and mastoid air cells is normal. No air-fluid levels. Regional soft tissues appear normal. No displaced calvarial fracture. CT CERVICAL SPINE FINDINGS C1 to the superior endplate of T3 is imaged. There is straightening and slight reversal the expected cervical lordosis with mild kyphosis centered about the C4-C5 articulation. No anterolisthesis or  retrolisthesis. The bilateral facets are normally aligned. The dens is normally positioned and a lateral masses of C1. Normal atlantodental and atlantoaxial articulations. No fracture or static subluxation of the cervical spine. Cervical vertebral body heights are preserved. Prevertebral soft tissues are normal. Mild to moderate multilevel cervical spine DDD, likely worse at C4-C5 and C5-C6 with disc space height loss, endplate irregularity and small posteriorly directed disc osteophyte complexes at these locations. Calcified atherosclerotic plaque within the right carotid bulb. No bulky cervical lymphadenopathy on this noncontrast examination. Normal noncontrast appearance of the thyroid gland. Limited visualization of the lung apices is  normal. IMPRESSION: 1. Negative noncontrast head CT. 2. No fracture or static subluxation of the cervical spine. 3. Mild straightening and slight reversal of the expected cervical lordosis, nonspecific though could be seen in the setting of muscle spasm. 4. Mild-to-moderate multilevel cervical spine DDD. Electronically Signed   By: Sandi Mariscal M.D.   On: 10/30/2015 00:06  Ct Cervical Spine Wo Contrast  Result Date: 10/30/2015 CLINICAL DATA:  Shooting pain to the right side of the head and dizziness for the past 3-4 days. EXAM: CT HEAD WITHOUT CONTRAST CT CERVICAL SPINE WITHOUT CONTRAST TECHNIQUE: Multidetector CT imaging of the head and cervical spine was performed following the standard protocol without intravenous contrast. Multiplanar CT image reconstructions of the cervical spine were also generated. COMPARISON:  None. FINDINGS: CT HEAD FINDINGS Gray-white differentiation is maintained. No CT evidence of acute large territory infarct. No intraparenchymal or extra-axial mass or hemorrhage. Normal size and configuration the ventricles and basilar cisterns. No midline shift. Minimal intracranial atherosclerosis. Limited visualization the paranasal sinuses and mastoid air cells  is normal. No air-fluid levels. Regional soft tissues appear normal. No displaced calvarial fracture. CT CERVICAL SPINE FINDINGS C1 to the superior endplate of T3 is imaged. There is straightening and slight reversal the expected cervical lordosis with mild kyphosis centered about the C4-C5 articulation. No anterolisthesis or retrolisthesis. The bilateral facets are normally aligned. The dens is normally positioned and a lateral masses of C1. Normal atlantodental and atlantoaxial articulations. No fracture or static subluxation of the cervical spine. Cervical vertebral body heights are preserved. Prevertebral soft tissues are normal. Mild to moderate multilevel cervical spine DDD, likely worse at C4-C5 and C5-C6 with disc space height loss, endplate irregularity and small posteriorly directed disc osteophyte complexes at these locations. Calcified atherosclerotic plaque within the right carotid bulb. No bulky cervical lymphadenopathy on this noncontrast examination. Normal noncontrast appearance of the thyroid gland. Limited visualization of the lung apices is normal. IMPRESSION: 1. Negative noncontrast head CT. 2. No fracture or static subluxation of the cervical spine. 3. Mild straightening and slight reversal of the expected cervical lordosis, nonspecific though could be seen in the setting of muscle spasm. 4. Mild-to-moderate multilevel cervical spine DDD. Electronically Signed   By: Sandi Mariscal M.D.   On: 10/30/2015 00:06   Assessment/Plan 65 year old lady with complaints of transient disequilibrium, most likely manifestations of vertigo. TIA is unlikely with symptoms not attributable to a remote vascular territory. Clinical examination was unremarkable, as was CT scan. CT of cervical spine showed degenerative changes which may be contributing to her headaches.  Recommendations: 1. MRI of the brain without contrast 2. Stroke workup if MRI shows indications of an acute ischemic lesion; otherwise no  further stroke workup is indicated 3. Aspirin 81 mg per day 4. Meclizine 25 mg 3 times a day when necessary if symptoms of disequilibrium return  C.R. Nicole Kindred, MD [facility] [phone]  10/30/2015, 5:25 AM

## 2015-10-30 NOTE — Discharge Summary (Signed)
Physician Discharge Summary  Linda Harvey [id number] DOB: 02/20/1951 DOA: 10/29/2015  PCP: Reginia Naas, MD  Admit date: 10/29/2015 Discharge date: 10/30/2015  Time spent: 35 minutes  Recommendations for Outpatient Follow-up:  1. Follow-up with PCP as an outpatient in one week 2. Follow-up with endocrinology as an outpatient for 3 mm pituitary lesion.   Discharge Diagnoses:  Principal Problem:   TIA (transient ischemic attack) Active Problems:   Essential hypertension   Sinus bradycardia   Dizziness   Cephalalgia   Discharge Condition: Stable  Diet recommendation: Cardiac  Filed Weights   10/30/15 0500  Weight: 95.3 kg (210 lb 1.6 oz)    History of present illness:  Linda Harvey is a 65 y.o. female with hypertension presents to the ER because of patient experiencing at least 3 episodes where patient felt that she was swaying towards the left side while walking in the gym. It happened in quick succession. Denies any visual symptoms or any weakness of the upper or lower extremities. Denies any difficulty speaking or swallowing. Over the last 2 weeks patient has been experiencing shooting pains in the right parietal area for scalp. CT of the head and C-spine was done which did not show anything acute except for degenerative disc disease. In the ER patient had bradycardic episodes with heart rate going up to 48 bpm However patient maintained blood pressure within normal limits. MRA was negative for any stroke. If MRI does not show any ischemic changes neurology recommended no further workup. Patient has been symptom-free without any episodes of dizziness, gait disturbance. Evaluated by physical therapy and patient was able to walk independently.     Consultations:  Neurology  Discharge Exam: Vitals:   10/30/15 0800 10/30/15 1000  BP: 123/69 134/77  Pulse: (!) 56 62  Resp: 16 18  Temp: 97.9 F (36.6 C) 97.7 F (36.5 C)    General: Well conscious oriented   Cardiovascular: S1-S2 regular Respiratory: Bilateral clear to auscultation   Discharge Instructions Return to the emergency department for patient's symptoms recurs   Discharge Medication List as of 10/30/2015 12:41 PM    CONTINUE these medications which have NOT CHANGED   Details  aspirin EC 81 MG tablet Take 81 mg by mouth daily., Historical Med    aspirin-acetaminophen-caffeine (EXCEDRIN MIGRAINE) 250-250-65 MG tablet Take 2 tablets by mouth every 6 (six) hours as needed for headache., Historical Med    calcium carbonate (OS-CAL - DOSED IN MG OF ELEMENTAL CALCIUM) 1250 (500 Ca) MG tablet Take 1 tablet by mouth daily with breakfast., Historical Med    cholecalciferol (VITAMIN D) 1000 units tablet Take 1,000 Units by mouth daily., Historical Med    diphenhydramine-acetaminophen (TYLENOL PM) 25-500 MG TABS tablet Take 1 tablet by mouth at bedtime as needed (sleep)., Historical Med    lisinopril-hydrochlorothiazide (PRINZIDE,ZESTORETIC) 20-25 MG per tablet Take 1 tablet by mouth daily., Starting Thu 07/11/2014, Historical Med    MELATONIN PO Take 1 tablet by mouth daily. , Historical Med    Multiple Vitamin (MULTIVITAMIN WITH MINERALS) TABS tablet Take 1 tablet by mouth daily., Historical Med    NITROSTAT 0.4 MG SL tablet Place 0.4 mg under the tongue every 5 (five) minutes as needed. , Starting Tue 06/11/2014, Historical Med    oxybutynin (DITROPAN-XL) 5 MG 24 hr tablet Take 5 mg by mouth at bedtime., Historical Med    simvastatin (ZOCOR) 20 MG tablet Take 20 mg by mouth daily., Historical Med    vitamin B-12 (CYANOCOBALAMIN) 1000 MCG tablet Take  1,000 mcg by mouth daily., Historical Med    cyclobenzaprine (FLEXERIL) 5 MG tablet Take 1 tablet (5 mg total) by mouth 3 (three) times daily., Starting Tue 12/31/2014, Print       No Known Allergies    The results of significant diagnostics from this hospitalization (including imaging, microbiology, ancillary and laboratory) are  listed below for reference.    Significant Diagnostic Studies: Ct Head Wo Contrast  Result Date: 10/30/2015 CLINICAL DATA:  Shooting pain to the right side of the head and dizziness for the past 3-4 days. EXAM: CT HEAD WITHOUT CONTRAST CT CERVICAL SPINE WITHOUT CONTRAST TECHNIQUE: Multidetector CT imaging of the head and cervical spine was performed following the standard protocol without intravenous contrast. Multiplanar CT image reconstructions of the cervical spine were also generated. COMPARISON:  None. FINDINGS: CT HEAD FINDINGS Gray-white differentiation is maintained. No CT evidence of acute large territory infarct. No intraparenchymal or extra-axial mass or hemorrhage. Normal size and configuration the ventricles and basilar cisterns. No midline shift. Minimal intracranial atherosclerosis. Limited visualization the paranasal sinuses and mastoid air cells is normal. No air-fluid levels. Regional soft tissues appear normal. No displaced calvarial fracture. CT CERVICAL SPINE FINDINGS C1 to the superior endplate of T3 is imaged. There is straightening and slight reversal the expected cervical lordosis with mild kyphosis centered about the C4-C5 articulation. No anterolisthesis or retrolisthesis. The bilateral facets are normally aligned. The dens is normally positioned and a lateral masses of C1. Normal atlantodental and atlantoaxial articulations. No fracture or static subluxation of the cervical spine. Cervical vertebral body heights are preserved. Prevertebral soft tissues are normal. Mild to moderate multilevel cervical spine DDD, likely worse at C4-C5 and C5-C6 with disc space height loss, endplate irregularity and small posteriorly directed disc osteophyte complexes at these locations. Calcified atherosclerotic plaque within the right carotid bulb. No bulky cervical lymphadenopathy on this noncontrast examination. Normal noncontrast appearance of the thyroid gland. Limited visualization of the lung  apices is normal. IMPRESSION: 1. Negative noncontrast head CT. 2. No fracture or static subluxation of the cervical spine. 3. Mild straightening and slight reversal of the expected cervical lordosis, nonspecific though could be seen in the setting of muscle spasm. 4. Mild-to-moderate multilevel cervical spine DDD. Electronically Signed   By: Simonne Come M.D.   On: 10/30/2015 00:06  Ct Cervical Spine Wo Contrast  Result Date: 10/30/2015 CLINICAL DATA:  Shooting pain to the right side of the head and dizziness for the past 3-4 days. EXAM: CT HEAD WITHOUT CONTRAST CT CERVICAL SPINE WITHOUT CONTRAST TECHNIQUE: Multidetector CT imaging of the head and cervical spine was performed following the standard protocol without intravenous contrast. Multiplanar CT image reconstructions of the cervical spine were also generated. COMPARISON:  None. FINDINGS: CT HEAD FINDINGS Gray-white differentiation is maintained. No CT evidence of acute large territory infarct. No intraparenchymal or extra-axial mass or hemorrhage. Normal size and configuration the ventricles and basilar cisterns. No midline shift. Minimal intracranial atherosclerosis. Limited visualization the paranasal sinuses and mastoid air cells is normal. No air-fluid levels. Regional soft tissues appear normal. No displaced calvarial fracture. CT CERVICAL SPINE FINDINGS C1 to the superior endplate of T3 is imaged. There is straightening and slight reversal the expected cervical lordosis with mild kyphosis centered about the C4-C5 articulation. No anterolisthesis or retrolisthesis. The bilateral facets are normally aligned. The dens is normally positioned and a lateral masses of C1. Normal atlantodental and atlantoaxial articulations. No fracture or static subluxation of the cervical spine. Cervical vertebral body heights  are preserved. Prevertebral soft tissues are normal. Mild to moderate multilevel cervical spine DDD, likely worse at C4-C5 and C5-C6 with disc space  height loss, endplate irregularity and small posteriorly directed disc osteophyte complexes at these locations. Calcified atherosclerotic plaque within the right carotid bulb. No bulky cervical lymphadenopathy on this noncontrast examination. Normal noncontrast appearance of the thyroid gland. Limited visualization of the lung apices is normal. IMPRESSION: 1. Negative noncontrast head CT. 2. No fracture or static subluxation of the cervical spine. 3. Mild straightening and slight reversal of the expected cervical lordosis, nonspecific though could be seen in the setting of muscle spasm. 4. Mild-to-moderate multilevel cervical spine DDD. Electronically Signed   By: Sandi Mariscal M.D.   On: 10/30/2015 00:06  Mr Brain Wo Contrast  Result Date: 10/30/2015 CLINICAL DATA:  TIA. Several episodes of disequilibrium, veering to the left side. Increasing frequency of shooting pains in the right side of the head. EXAM: MRI HEAD WITHOUT CONTRAST TECHNIQUE: Multiplanar, multiecho pulse sequences of the brain and surrounding structures were obtained without intravenous contrast. COMPARISON:  Head CT 10/29/2015 FINDINGS: There is no evidence of acute infarct, intracranial hemorrhage, mass, midline shift, or extra-axial fluid collection. The ventricles and sulci are normal for age. Scattered punctate foci of T2 hyperintensity in the cerebral white matter are nonspecific but compatible with minimal chronic small vessel ischemic disease. The brainstem and cerebellum are unremarkable. Orbits are unremarkable. Paranasal sinuses and mastoid air cells are clear. There is a 3 mm T2 hyperintense focus in the pituitary gland just left of midline. No bone marrow lesion. IMPRESSION: 1. No acute intracranial abnormality. 2. Minimal chronic small vessel ischemic disease. 3. 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 If there are clinical signs of endocrine dysfunction, laboratory workup  is recommended. Outpatient pituitary protocol brain MRI could be performed if there are laboratory abnormalities suggestive of a pituitary microadenoma or other alternative diagnosis. Electronically Signed   By: Logan Bores M.D.   On: 10/30/2015 09:51   Microbiology: No results found for this or any previous visit (from the past 240 hour(s)).   Labs: Basic Metabolic Panel:  Recent Labs Lab 10/29/15 2326 10/30/15 0538  NA 140 138  K 4.2 3.7  CL 108 105  CO2 26 24  GLUCOSE 108* 107*  BUN 28* 21*  CREATININE 1.09* 0.92  CALCIUM 9.3 8.9   Liver Function Tests:  Recent Labs Lab 10/29/15 2326 10/30/15 0538  AST 34 31  ALT 30 28  ALKPHOS 67 56  BILITOT 0.5 0.7  PROT 7.3 6.2*  ALBUMIN 4.4 3.7   No results for input(s): LIPASE, AMYLASE in the last 168 hours. No results for input(s): AMMONIA in the last 168 hours. CBC:  Recent Labs Lab 10/29/15 2326 10/30/15 0538  WBC 7.6 5.5  NEUTROABS 4.2  --   HGB 13.4 12.3  HCT 39.2 35.6*  MCV 95.4 94.2  PLT 268 251   Cardiac Enzymes:  Recent Labs Lab 10/29/15 2326  TROPONINI <0.03   BNP: BNP (last 3 results) No results for input(s): BNP in the last 8760 hours.  ProBNP (last 3 results) No results for input(s): PROBNP in the last 8760 hours.  CBG: No results for input(s): GLUCAP in the last 168 hours.     SignedMonica Becton MD.  [facility] 10/30/2015, 6:44 PM

## 2015-10-30 NOTE — Evaluation (Signed)
Occupational Therapy Evaluation and Discharge Patient Details Name: Linda Harvey MRN: [id number] DOB: Apr 05, 1951 Today's Date: 10/30/2015    History of Present Illness 65 y.o. female with hypertension presents to the ER because of patient experiencing at least 3 episodes where patient felt that she was swaying towards the left side while walking in the gym. CT on 7/26 and MRI on 7/27 neg for acute abnormalities.   Clinical Impression   Pt reports she was independent with ADL PTA. Currently pt overall supervision for safety with ADL and functional mobility secondary to pt feeling "loopy"; suspect medication related. Educated pt and husband on home safety and fall prevention. Pt planning to d/c home with intermittent supervision from family. No further acute OT needs identified; signing off at this time. Please re-consult if needs change. Thank you for this referral.    Follow Up Recommendations  No OT follow up;Supervision - Intermittent    Equipment Recommendations  None recommended by OT    Recommendations for Other Services PT consult     Precautions / Restrictions Precautions Precautions: None Restrictions Weight Bearing Restrictions: No      Mobility Bed Mobility Overal bed mobility: Modified Independent             General bed mobility comments: Slight increase in time.  Transfers Overall transfer level: Needs assistance Equipment used: None Transfers: Sit to/from Stand Sit to Stand: Supervision         General transfer comment: Supervision for safety due to slight lethargy. Pt slightly unsteady on feet but no major LOB during functional activites.    Balance Overall balance assessment: Needs assistance Sitting-balance support: Feet supported;No upper extremity supported Sitting balance-Leahy Scale: Good     Standing balance support: No upper extremity supported;During functional activity Standing balance-Leahy Scale: Good                               ADL Overall ADL's : Needs assistance/impaired Eating/Feeding: Independent;Sitting   Grooming: Supervision/safety;Standing;Oral care;Wash/dry face;Wash/dry hands   Upper Body Bathing: Set up;Sitting   Lower Body Bathing: Supervison/ safety;Sit to/from stand   Upper Body Dressing : Set up;Sitting Upper Body Dressing Details (indicate cue type and reason): to don hospital gown Lower Body Dressing: Supervision/safety;Sit to/from stand Lower Body Dressing Details (indicate cue type and reason): Pt able to adjust socks sitting EOB Toilet Transfer: Supervision/safety;Ambulation;Regular Toilet   Toileting- Water quality scientist and Hygiene: Supervision/safety;Sit to/from stand   Tub/ Shower Transfer: Supervision/safety;Tub transfer;Ambulation Tub/Shower Transfer Details (indicate cue type and reason): Simulated tub transfer in room Functional mobility during ADLs: Supervision/safety General ADL Comments: Supervision provided for throughout functional mobility and activities this session secondary to pt feeling "loopy" (?med related).  Feel pt will quickly be able to return to mod I/independent level of function with resolution of "loopy" feeling.     Vision Vision Assessment?: No apparent visual deficits   Perception     Praxis      Pertinent Vitals/Pain Pain Assessment: No/denies pain     Hand Dominance Right   Extremity/Trunk Assessment Upper Extremity Assessment Upper Extremity Assessment: Overall WFL for tasks assessed   Lower Extremity Assessment Lower Extremity Assessment: Defer to PT evaluation   Cervical / Trunk Assessment Cervical / Trunk Assessment: Normal   Communication Communication Communication: No difficulties   Cognition Arousal/Alertness: Awake/alert Behavior During Therapy: WFL for tasks assessed/performed Overall Cognitive Status: Within Functional Limits for tasks assessed (Easily distracted. Feels "loopy"-?meds)  General Comments       Exercises       Shoulder Instructions      Home Living Family/patient expects to be discharged to:: Private residence Living Arrangements: Spouse/significant other Available Help at Discharge: Family;Available PRN/intermittently Type of Home: House Home Access: Ramped entrance     Home Layout: One level     Bathroom Shower/Tub: Tub/shower unit Shower/tub characteristics: Curtain Biochemist, clinical: Standard     Home Equipment: Environmental consultant - 2 wheels;Cane - single point;Wheelchair - manual          Prior Functioning/Environment Level of Independence: Independent        Comments: works as a Scientist, water quality at [employer], goes to [employer] and walks on the treadmil and track    OT Diagnosis: Generalized weakness   OT Problem List:     OT Treatment/Interventions:      OT Goals(Current goals can be found in the care plan section) Acute Rehab OT Goals Patient Stated Goal: home today OT Goal Formulation: All assessment and education complete, DC therapy  OT Frequency:     Barriers to D/C:            Co-evaluation              End of Session Nurse Communication: Mobility status  Activity Tolerance: Patient tolerated treatment well Patient left: in chair;with call bell/phone within reach;with chair alarm set;with family/visitor present   Time: 1041-1102 OT Time Calculation (min): 21 min Charges:  OT General Charges $OT Visit: 1 Procedure OT Evaluation $OT Eval Low Complexity: 1 Procedure G-Codes: OT G-codes **NOT FOR INPATIENT CLASS** Functional Assessment Tool Used: Clinical judgement Functional Limitation: Self care Self Care Current Status [id number]): At least 1 percent but less than 20 percent impaired, limited or restricted Self Care Goal Status [id number]): At least 1 percent but less than 20 percent impaired, limited or restricted Self Care Discharge Status [phone]): At least 1 percent but less than 20 percent impaired, limited or restricted    Binnie Kand M.S., OTR/L Pager: [phone]  10/30/2015, 11:24 AM

## 2015-10-30 NOTE — ED Notes (Signed)
[facility] called--- report on pt provided. 

## 2015-10-30 NOTE — ED Notes (Signed)
[facility] here to transfer pt to [facility]. 

## 2015-10-30 NOTE — H&P (Signed)
History and Physical    Linda Harvey [id number] DOB: 08-21-1950 DOA: 10/29/2015  PCP: Reginia Naas, MD  Patient coming from: Home.  Chief Complaint: Balance issues while walking.  HPI: Linda Harvey is a 65 y.o. female with hypertension presents to the ER because of patient experiencing at least 3 episodes where patient felt that she was swaying towards the left side while walking in the gym yesterday afternoon. It happened in quick succession. Denies any visual symptoms or any weakness of the upper or lower extremities. Denies any difficulty speaking or swallowing. Over the last 2 weeks patient has been experiencing shooting pains in the right parietal area for scalp. CT of the head and C-spine was done which did not show anything acute except for degenerative disc disease. In the ER patient had bradycardic episodes with heart rate going up to 48 bpm. Patient is being admitted for possible TIA and also further observation and management of her bradycardia. Patient is not on any beta blockers.   ED Course: CT head and C-spine was done which was unremarkable except for degenerative disc disease.  Review of Systems: As per HPI, rest all negative.   Past Medical History:  Diagnosis Date  . Essential hypertension   . Hyperlipidemia   . Insomnia   . Obesity (BMI 35.0-39.9 without comorbidity) ([place])   . Overactive bladder   . Severe Acute Febrile Illness as Child     Past Surgical History:  Procedure Laterality Date  . NM MYOVIEW LTD  06/25/14   Low Risk ST - normal perfusion, abnormal GXT, EF ~70%  . TRANSTHORACIC ECHOCARDIOGRAM  06/25/14    LV size & function - EF 55-60%, Gr 1 DD, mild-mod TR, Tr MR; Mild Aortic Sclerosis  . TUBAL LIGATION Bilateral      reports that she has never smoked. She has never used smokeless tobacco. She reports that she drinks alcohol. She reports that she does not use drugs.  No Known Allergies  Family History  Problem Relation Age of  Onset  . Diabetes Mother   . Cancer Father   . Diabetes Son   . Epilepsy Son   . Diabetes Sister     Prior to Admission medications   Medication Sig Start Date End Date Taking? Authorizing Provider  aspirin EC 81 MG tablet Take 81 mg by mouth daily.   Yes Historical Provider, MD  aspirin-acetaminophen-caffeine (EXCEDRIN MIGRAINE) [phone] MG tablet Take 2 tablets by mouth every 6 (six) hours as needed for headache.   Yes Historical Provider, MD  calcium carbonate (OS-CAL - DOSED IN MG OF ELEMENTAL CALCIUM) 1250 (500 Ca) MG tablet Take 1 tablet by mouth daily with breakfast.   Yes Historical Provider, MD  cholecalciferol (VITAMIN D) 1000 units tablet Take 1,000 Units by mouth daily.   Yes Historical Provider, MD  diphenhydramine-acetaminophen (TYLENOL PM) 25-500 MG TABS tablet Take 1 tablet by mouth at bedtime as needed (sleep).   Yes Historical Provider, MD  lisinopril-hydrochlorothiazide (PRINZIDE,ZESTORETIC) 20-25 MG per tablet Take 1 tablet by mouth daily. 07/11/14  Yes Historical Provider, MD  MELATONIN PO Take 1 tablet by mouth daily.    Yes Historical Provider, MD  Multiple Vitamin (MULTIVITAMIN WITH MINERALS) TABS tablet Take 1 tablet by mouth daily.   Yes Historical Provider, MD  NITROSTAT 0.4 MG SL tablet Place 0.4 mg under the tongue every 5 (five) minutes as needed.  06/11/14  Yes Historical Provider, MD  oxybutynin (DITROPAN-XL) 5 MG 24 hr tablet Take 5 mg by  mouth at bedtime.   Yes Historical Provider, MD  simvastatin (ZOCOR) 20 MG tablet Take 20 mg by mouth daily.   Yes Historical Provider, MD  vitamin B-12 (CYANOCOBALAMIN) 1000 MCG tablet Take 1,000 mcg by mouth daily.   Yes Historical Provider, MD  cyclobenzaprine (FLEXERIL) 5 MG tablet Take 1 tablet (5 mg total) by mouth 3 (three) times daily. Patient not taking: Reported on 10/29/2015 12/31/14   Charlann Lange, PA-C    Physical Exam: Vitals:   10/29/15 2338 10/29/15 2348 10/30/15 0007 10/30/15 0145  BP:  142/81 (!) 135/53  146/67  Pulse:  (!) 56 (!) 53 (!) 53  Resp:  20 19 18   Temp: 97.6 F (36.4 C) 97.6 F (36.4 C)    TempSrc:  Oral    SpO2:  99% 100% 99%      Constitutional: Not in distress. Vitals:   10/29/15 2338 10/29/15 2348 10/30/15 0007 10/30/15 0145  BP:  142/81 (!) 135/53 146/67  Pulse:  (!) 56 (!) 53 (!) 53  Resp:  20 19 18   Temp: 97.6 F (36.4 C) 97.6 F (36.4 C)    TempSrc:  Oral    SpO2:  99% 100% 99%   Eyes: Anicteric no pallor. ENMT: No discharge from the ears eyes nose or mouth. Neck: No mass felt. No JVD appreciated. Respiratory: No rhonchi or crepitations. Cardiovascular: S1-S2 heard. Abdomen: Soft nontender bowel sounds present. Musculoskeletal: No edema. Skin: No rash. Neurologic: Alert awake oriented to time place and person. Moves all extremities 5 x 5. No facial asymmetry. Tongue is midline. Psychiatric: Appears normal.   Labs on Admission: I have personally reviewed following labs and imaging studies  CBC:  Recent Labs Lab 10/29/15 2326  WBC 7.6  NEUTROABS 4.2  HGB 13.4  HCT 39.2  MCV 95.4  PLT [id number]   Basic Metabolic Panel:  Recent Labs Lab 10/29/15 2326  NA 140  K 4.2  CL 108  CO2 26  GLUCOSE 108*  BUN 28*  CREATININE 1.09*  CALCIUM 9.3   GFR: CrCl cannot be calculated (Unknown ideal weight.). Liver Function Tests:  Recent Labs Lab 10/29/15 2326  AST 34  ALT 30  ALKPHOS 67  BILITOT 0.5  PROT 7.3  ALBUMIN 4.4   No results for input(s): LIPASE, AMYLASE in the last 168 hours. No results for input(s): AMMONIA in the last 168 hours. Coagulation Profile: No results for input(s): INR, PROTIME in the last 168 hours. Cardiac Enzymes:  Recent Labs Lab 10/29/15 2326  TROPONINI <0.03   BNP (last 3 results) No results for input(s): PROBNP in the last 8760 hours. HbA1C: No results for input(s): HGBA1C in the last 72 hours. CBG: No results for input(s): GLUCAP in the last 168 hours. Lipid Profile: No results for input(s): CHOL,  HDL, LDLCALC, TRIG, CHOLHDL, LDLDIRECT in the last 72 hours. Thyroid Function Tests: No results for input(s): TSH, T4TOTAL, FREET4, T3FREE, THYROIDAB in the last 72 hours. Anemia Panel: No results for input(s): VITAMINB12, FOLATE, FERRITIN, TIBC, IRON, RETICCTPCT in the last 72 hours. Urine analysis:    Component Value Date/Time   COLORURINE YELLOW 10/29/2015 2341   APPEARANCEUR CLEAR 10/29/2015 2341   LABSPEC 1.026 10/29/2015 2341   PHURINE 5.5 10/29/2015 2341   GLUCOSEU NEGATIVE 10/29/2015 2341   HGBUR NEGATIVE 10/29/2015 2341   BILIRUBINUR NEGATIVE 10/29/2015 2341   KETONESUR NEGATIVE 10/29/2015 2341   PROTEINUR NEGATIVE 10/29/2015 2341   NITRITE NEGATIVE 10/29/2015 2341   LEUKOCYTESUR NEGATIVE 10/29/2015 2341   Sepsis Labs: @LABRCNTIP (procalcitonin:4,lacticidven:4) )  No results found for this or any previous visit (from the past 240 hour(s)).   Radiological Exams on Admission: Ct Head Wo Contrast  Result Date: 10/30/2015 CLINICAL DATA:  Shooting pain to the right side of the head and dizziness for the past 3-4 days. EXAM: CT HEAD WITHOUT CONTRAST CT CERVICAL SPINE WITHOUT CONTRAST TECHNIQUE: Multidetector CT imaging of the head and cervical spine was performed following the standard protocol without intravenous contrast. Multiplanar CT image reconstructions of the cervical spine were also generated. COMPARISON:  None. FINDINGS: CT HEAD FINDINGS Gray-white differentiation is maintained. No CT evidence of acute large territory infarct. No intraparenchymal or extra-axial mass or hemorrhage. Normal size and configuration the ventricles and basilar cisterns. No midline shift. Minimal intracranial atherosclerosis. Limited visualization the paranasal sinuses and mastoid air cells is normal. No air-fluid levels. Regional soft tissues appear normal. No displaced calvarial fracture. CT CERVICAL SPINE FINDINGS C1 to the superior endplate of T3 is imaged. There is straightening and slight reversal  the expected cervical lordosis with mild kyphosis centered about the C4-C5 articulation. No anterolisthesis or retrolisthesis. The bilateral facets are normally aligned. The dens is normally positioned and a lateral masses of C1. Normal atlantodental and atlantoaxial articulations. No fracture or static subluxation of the cervical spine. Cervical vertebral body heights are preserved. Prevertebral soft tissues are normal. Mild to moderate multilevel cervical spine DDD, likely worse at C4-C5 and C5-C6 with disc space height loss, endplate irregularity and small posteriorly directed disc osteophyte complexes at these locations. Calcified atherosclerotic plaque within the right carotid bulb. No bulky cervical lymphadenopathy on this noncontrast examination. Normal noncontrast appearance of the thyroid gland. Limited visualization of the lung apices is normal. IMPRESSION: 1. Negative noncontrast head CT. 2. No fracture or static subluxation of the cervical spine. 3. Mild straightening and slight reversal of the expected cervical lordosis, nonspecific though could be seen in the setting of muscle spasm. 4. Mild-to-moderate multilevel cervical spine DDD. Electronically Signed   By: Sandi Mariscal M.D.   On: 10/30/2015 00:06  Ct Cervical Spine Wo Contrast  Result Date: 10/30/2015 CLINICAL DATA:  Shooting pain to the right side of the head and dizziness for the past 3-4 days. EXAM: CT HEAD WITHOUT CONTRAST CT CERVICAL SPINE WITHOUT CONTRAST TECHNIQUE: Multidetector CT imaging of the head and cervical spine was performed following the standard protocol without intravenous contrast. Multiplanar CT image reconstructions of the cervical spine were also generated. COMPARISON:  None. FINDINGS: CT HEAD FINDINGS Gray-white differentiation is maintained. No CT evidence of acute large territory infarct. No intraparenchymal or extra-axial mass or hemorrhage. Normal size and configuration the ventricles and basilar cisterns. No midline  shift. Minimal intracranial atherosclerosis. Limited visualization the paranasal sinuses and mastoid air cells is normal. No air-fluid levels. Regional soft tissues appear normal. No displaced calvarial fracture. CT CERVICAL SPINE FINDINGS C1 to the superior endplate of T3 is imaged. There is straightening and slight reversal the expected cervical lordosis with mild kyphosis centered about the C4-C5 articulation. No anterolisthesis or retrolisthesis. The bilateral facets are normally aligned. The dens is normally positioned and a lateral masses of C1. Normal atlantodental and atlantoaxial articulations. No fracture or static subluxation of the cervical spine. Cervical vertebral body heights are preserved. Prevertebral soft tissues are normal. Mild to moderate multilevel cervical spine DDD, likely worse at C4-C5 and C5-C6 with disc space height loss, endplate irregularity and small posteriorly directed disc osteophyte complexes at these locations. Calcified atherosclerotic plaque within the right carotid bulb. No bulky cervical  lymphadenopathy on this noncontrast examination. Normal noncontrast appearance of the thyroid gland. Limited visualization of the lung apices is normal. IMPRESSION: 1. Negative noncontrast head CT. 2. No fracture or static subluxation of the cervical spine. 3. Mild straightening and slight reversal of the expected cervical lordosis, nonspecific though could be seen in the setting of muscle spasm. 4. Mild-to-moderate multilevel cervical spine DDD. Electronically Signed   By: Sandi Mariscal M.D.   On: 10/30/2015 00:06   EKG: Independently reviewed. Sinus bradycardia.  Assessment/Plan Principal Problem:   TIA (transient ischemic attack) Active Problems:   Essential hypertension   Sinus bradycardia    1. Disequilibrium concerning for TIA - discussed with Dr. Wallie Char, on-call neurologist who at this time advised. MRI brain and if MRI suggests stroke then further work stroke  workup. Neurology will be seeing patient in consult. 2. Right parietal headaches - could be from cervical radiculopathy. Check sedimentation rate. 3. Sinus bradycardia - patient's heart rate was falling into the 48 while in the ER. Closely monitor in telemetry. This could also account for #1. Check TSH. 4. Hypertension - hold hydrochlorothiazide for now. Gentle hydration. Continue lisinopril.  Patient will be transferred to [facility]. Patient is unable to transfer. Dr. Blaine Hamper will be the accepting physician.   DVT prophylaxis: Lovenox. Code Status: Full code.  Family Communication: Patient's husband.  Disposition Plan: Home.  Consults called: Neurology.  Admission status: Observation. Telemetry.    Rise Patience MD [facility] Pager [phone].  If 7PM-7AM, please contact night-coverage [email or web address] Password [facility]  10/30/2015, 2:37 AM

## 2015-10-30 NOTE — ED Notes (Signed)
(  Husband) Yitty Dacres: [phone]

## 2015-10-30 NOTE — Care Management Note (Signed)
Case Management Note  Patient Details  Name: Lavella Skrine MRN: [id number] Date of Birth: 03/21/1951  Subjective/Objective:                    Action/Plan: Pt discharging home with self care. No further needs per CM.   Expected Discharge Date:                  Expected Discharge Plan:  Home/Self Care  In-House Referral:     Discharge planning Services     Post Acute Care Choice:    Choice offered to:     DME Arranged:    DME Agency:     HH Arranged:    [place] Agency:     Status of Service:  Completed, signed off  If discussed at [employer] of Stay Meetings, dates discussed:    Additional Comments:  Pollie Friar, RN 10/30/2015, 12:26 PM

## 2015-10-30 NOTE — Progress Notes (Signed)
Patient is being d/c home. D/c instructions given and patient verbalized understanding. 

## 2015-10-30 NOTE — ED Notes (Signed)
Report given a Linda Harvey

## 2015-10-30 NOTE — Care Management Obs Status (Signed)
[place] NOTIFICATION   Patient Details  Name: Jacarra Attridge MRN: [id number] Date of Birth: 09-27-1950   Medicare Observation Status Notification Given:  Yes    Pollie Friar, RN 10/30/2015, 1:30 PM

## 2015-10-30 NOTE — Evaluation (Addendum)
Physical Therapy Evaluation Patient Details Name: Linda Harvey MRN: [id number] DOB: 1950-11-05 Today's Date: 10/30/2015   History of Present Illness  65 y.o. female with hypertension presents to the ER because of patient experiencing at least 3 episodes where patient felt that she was swaying towards the left side while walking in the gym. CT on 7/26 and MRI on 7/27 neg for acute abnormalities.  Clinical Impression  Patient is functioning at Supervision to Mod I level with all mobility and gait.  Able to negotiate stairs with supervision.  No acute PT needs identified - PT will sign off.    Follow Up Recommendations No PT follow up;Supervision - Intermittent    Equipment Recommendations  None recommended by PT    Recommendations for Other Services       Precautions / Restrictions Precautions Precautions: None Restrictions Weight Bearing Restrictions: No      Mobility  Bed Mobility Overal bed mobility: Modified Independent             General bed mobility comments: Patient in chair  Transfers Overall transfer level: Modified independent Equipment used: None Transfers: Sit to/from Stand Sit to Stand: Modified independent (Device/Increase time)         General transfer comment: Increased time.  No loss of balance in stance.  Ambulation/Gait Ambulation/Gait assistance: Supervision Ambulation Distance (Feet): 200 Feet Assistive device: None Gait Pattern/deviations: Step-through pattern;Drifts right/left   Gait velocity interpretation: at or above normal speed for age/gender General Gait Details: Patient with slight unsteadiness at times, but with no loss of balance.  Question med related.  History of vertigo, but no dizziness noted today.  Stairs Stairs: Yes Stairs assistance: Supervision Stair Management: One rail Right;Alternating pattern;Forwards Number of Stairs: 4 General stair comments: Supervision for safety only.  Wheelchair Mobility    Modified  Rankin (Stroke Patients Only) Modified Rankin (Stroke Patients Only) Pre-Morbid Rankin Score: No symptoms Modified Rankin: No symptoms     Balance Overall balance assessment: Needs assistance Sitting-balance support: Feet supported;No upper extremity supported Sitting balance-Leahy Scale: Good     Standing balance support: No upper extremity supported;During functional activity Standing balance-Leahy Scale: Good               High level balance activites: Backward walking;Direction changes;Turns;Sudden stops;Head turns High Level Balance Comments: No loss of balance with high level balance activities.             Pertinent Vitals/Pain Pain Assessment: No/denies pain    Home Living Family/patient expects to be discharged to:: Private residence Living Arrangements: Spouse/significant other Available Help at Discharge: Family;Available PRN/intermittently Type of Home: House Home Access: Ramped entrance     Home Layout: One level Home Equipment: Walker - 2 wheels;Cane - single point;Wheelchair - manual      Prior Function Level of Independence: Independent         Comments: works as a Scientist, water quality at [employer], goes to [employer] and walks on the treadmil and track     [place]: Right    Extremity/Trunk Assessment   Upper Extremity Assessment: Defer to OT evaluation           Lower Extremity Assessment: Overall WFL for tasks assessed      Cervical / Trunk Assessment: Normal  Communication   Communication: No difficulties  Cognition Arousal/Alertness: Awake/alert Behavior During Therapy: WFL for tasks assessed/performed Overall Cognitive Status: Within Functional Limits for tasks assessed  General Comments      Exercises        Assessment/Plan    PT Assessment Patent does not need any further PT services  PT Diagnosis Abnormality of gait   PT Problem List    PT Treatment Interventions     PT  Goals (Current goals can be found in the Care Plan section) Acute Rehab PT Goals Patient Stated Goal: home today PT Goal Formulation: All assessment and education complete, DC therapy    Frequency     Barriers to discharge        Co-evaluation               End of Session   Activity Tolerance: Patient tolerated treatment well Patient left: in chair;with call bell/phone within reach;with chair alarm set;with family/visitor present Nurse Communication: Mobility status    Functional Assessment Tool Used: Clinical judgement Functional Limitation: Mobility: Walking and moving around Mobility: Walking and Moving Around Current Status [id number]): 0 percent impaired, limited or restricted Mobility: Walking and Moving Around Goal Status [phone]): 0 percent impaired, limited or restricted Mobility: Walking and Moving Around Discharge Status [phone]): 0 percent impaired, limited or restricted    Time: 1141-1156 PT Time Calculation (min) (ACUTE ONLY): 15 min   Charges:   PT Evaluation $PT Eval Low Complexity: 1 Procedure     PT G Codes:   PT G-Codes **NOT FOR INPATIENT CLASS** Functional Assessment Tool Used: Clinical judgement Functional Limitation: Mobility: Walking and moving around Mobility: Walking and Moving Around Current Status [id number]): 0 percent impaired, limited or restricted Mobility: Walking and Moving Around Goal Status [id number]): 0 percent impaired, limited or restricted Mobility: Walking and Moving Around Discharge Status [id number]): 0 percent impaired, limited or restricted    Despina Pole 10/30/2015, 12:28 PM

## 2015-10-31 LAB — HEMOGLOBIN A1C
Hgb A1c MFr Bld: 5.4 % (ref 4.8–5.6)
Mean Plasma Glucose: 108 mg/dL

## 2015-11-05 DIAGNOSIS — N3281 Overactive bladder: Secondary | ICD-10-CM | POA: Diagnosis not present

## 2015-11-05 DIAGNOSIS — R531 Weakness: Secondary | ICD-10-CM | POA: Diagnosis not present

## 2015-11-05 DIAGNOSIS — R2689 Other abnormalities of gait and mobility: Secondary | ICD-10-CM | POA: Diagnosis not present

## 2015-11-05 DIAGNOSIS — R5383 Other fatigue: Secondary | ICD-10-CM | POA: Diagnosis not present

## 2015-11-05 DIAGNOSIS — F419 Anxiety disorder, unspecified: Secondary | ICD-10-CM | POA: Diagnosis not present

## 2016-04-27 DIAGNOSIS — H04123 Dry eye syndrome of bilateral lacrimal glands: Secondary | ICD-10-CM | POA: Diagnosis not present

## 2016-04-27 DIAGNOSIS — H16143 Punctate keratitis, bilateral: Secondary | ICD-10-CM | POA: Diagnosis not present

## 2016-08-31 DIAGNOSIS — F419 Anxiety disorder, unspecified: Secondary | ICD-10-CM | POA: Diagnosis not present

## 2016-08-31 DIAGNOSIS — G2581 Restless legs syndrome: Secondary | ICD-10-CM | POA: Diagnosis not present

## 2016-08-31 DIAGNOSIS — N3281 Overactive bladder: Secondary | ICD-10-CM | POA: Diagnosis not present

## 2016-08-31 DIAGNOSIS — I10 Essential (primary) hypertension: Secondary | ICD-10-CM | POA: Diagnosis not present

## 2016-08-31 DIAGNOSIS — E7800 Pure hypercholesterolemia, unspecified: Secondary | ICD-10-CM | POA: Diagnosis not present

## 2016-09-29 DIAGNOSIS — G2581 Restless legs syndrome: Secondary | ICD-10-CM | POA: Diagnosis not present

## 2016-09-29 DIAGNOSIS — I10 Essential (primary) hypertension: Secondary | ICD-10-CM | POA: Diagnosis not present

## 2016-09-29 DIAGNOSIS — N3281 Overactive bladder: Secondary | ICD-10-CM | POA: Diagnosis not present

## 2016-11-23 DIAGNOSIS — Z23 Encounter for immunization: Secondary | ICD-10-CM | POA: Diagnosis not present

## 2016-11-23 DIAGNOSIS — N3281 Overactive bladder: Secondary | ICD-10-CM | POA: Diagnosis not present

## 2016-11-23 DIAGNOSIS — I10 Essential (primary) hypertension: Secondary | ICD-10-CM | POA: Diagnosis not present

## 2016-12-29 DIAGNOSIS — I10 Essential (primary) hypertension: Secondary | ICD-10-CM | POA: Diagnosis not present

## 2016-12-29 DIAGNOSIS — F419 Anxiety disorder, unspecified: Secondary | ICD-10-CM | POA: Diagnosis not present

## 2016-12-29 DIAGNOSIS — E7800 Pure hypercholesterolemia, unspecified: Secondary | ICD-10-CM | POA: Diagnosis not present

## 2016-12-29 DIAGNOSIS — G2581 Restless legs syndrome: Secondary | ICD-10-CM | POA: Diagnosis not present

## 2016-12-29 DIAGNOSIS — N3281 Overactive bladder: Secondary | ICD-10-CM | POA: Diagnosis not present

## 2016-12-29 DIAGNOSIS — N183 Chronic kidney disease, stage 3 (moderate): Secondary | ICD-10-CM | POA: Diagnosis not present

## 2017-06-28 DIAGNOSIS — I10 Essential (primary) hypertension: Secondary | ICD-10-CM | POA: Diagnosis not present

## 2017-06-28 DIAGNOSIS — N3281 Overactive bladder: Secondary | ICD-10-CM | POA: Diagnosis not present

## 2017-06-28 DIAGNOSIS — R220 Localized swelling, mass and lump, head: Secondary | ICD-10-CM | POA: Diagnosis not present

## 2017-06-28 DIAGNOSIS — N183 Chronic kidney disease, stage 3 (moderate): Secondary | ICD-10-CM | POA: Diagnosis not present

## 2017-06-28 DIAGNOSIS — E7800 Pure hypercholesterolemia, unspecified: Secondary | ICD-10-CM | POA: Diagnosis not present

## 2017-06-28 DIAGNOSIS — F419 Anxiety disorder, unspecified: Secondary | ICD-10-CM | POA: Diagnosis not present

## 2017-06-28 DIAGNOSIS — G2581 Restless legs syndrome: Secondary | ICD-10-CM | POA: Diagnosis not present

## 2017-06-28 DIAGNOSIS — E669 Obesity, unspecified: Secondary | ICD-10-CM | POA: Diagnosis not present

## 2017-08-02 IMAGING — MR MR HEAD W/O CM
9 of 10 series · 35 of 48 positions shown · non-contrast
Comparison: Head CT 10/29/2015

CLINICAL DATA: TIA. Several episodes of disequilibrium, veering to
the left side. Increasing frequency of shooting pains in the right
side of the head.

EXAM:
MRI HEAD WITHOUT CONTRAST
TECHNIQUE: Multiplanar, multiecho pulse sequences of the brain and surrounding
structures were obtained without intravenous contrast.

[Series 3: T1 · sagittal · 5.0mm · 0.47mm/px · 1 of 23 slices shown]
[im 1/23]
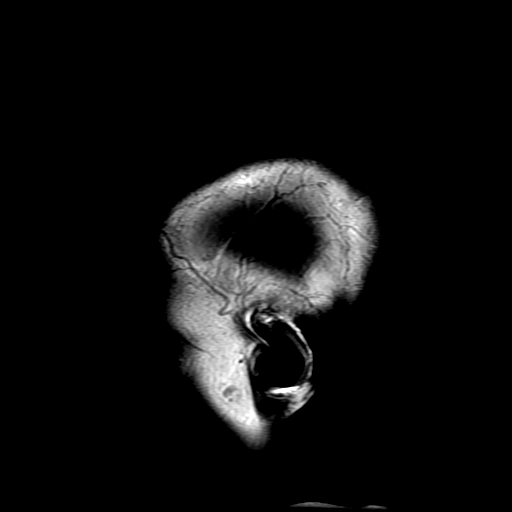

[Series 4: DWI · axial · 3.0mm · 1.09mm/px · z∈[-56,+77]mm · 8 of 94 slices shown (1 of 4)]
[im 1/94]
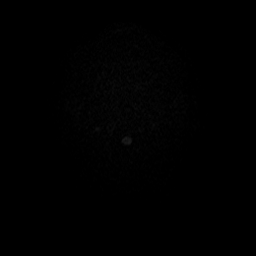
[im 11/94]
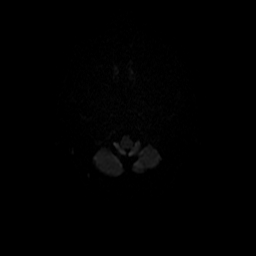
[im 32/94]
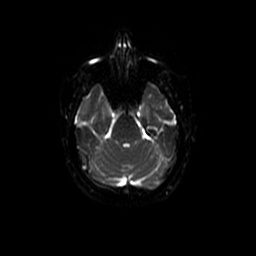
[im 42/94]
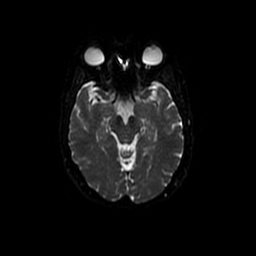
[im 52/94]
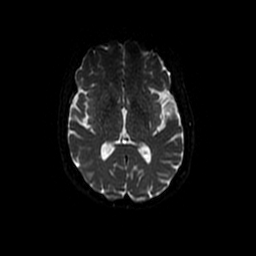
[im 63/94]
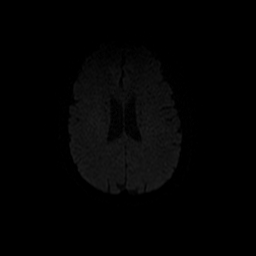
[im 83/94]
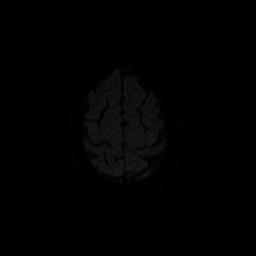
[im 94/94]
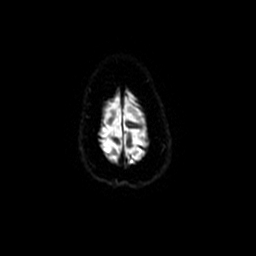

[Series 5: T2 · axial · 5.0mm · 0.47mm/px · z∈[-52,+85]mm · 3 of 25 slices shown (1 of 2)]
[im 1/25]
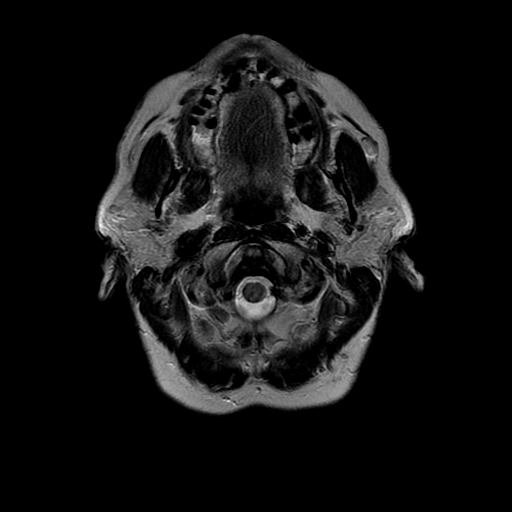
[im 13/25]
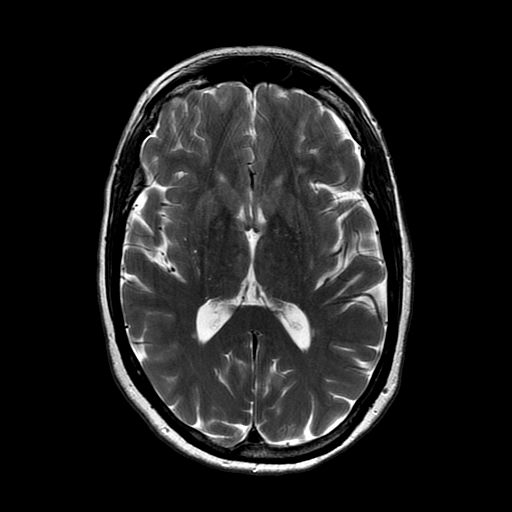
[im 25/25]
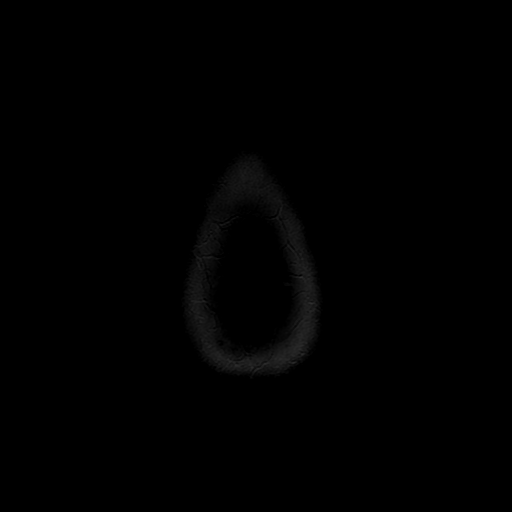

[Series 6: DWI · coronal · 5.0mm · 1.09mm/px · 7 of 66 slices shown (2 of 4)]
[im 1/66]
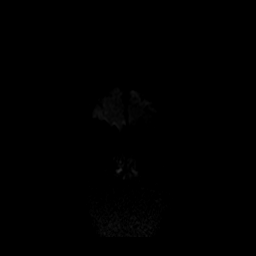
[im 11/66]
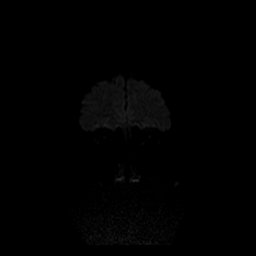
[im 22/66]
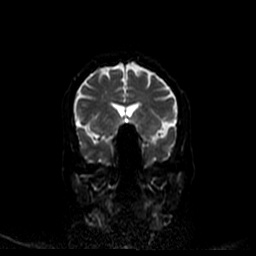
[im 33/66]
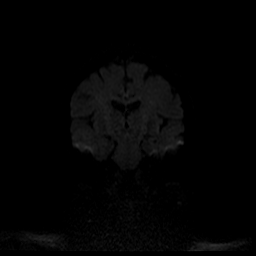
[im 44/66]
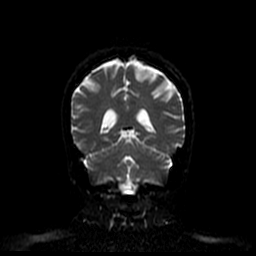
[im 55/66]
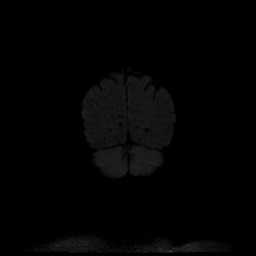
[im 66/66]
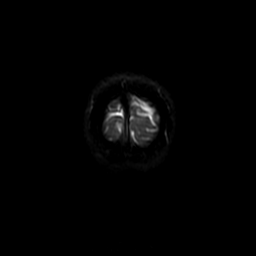

[Series 7: FLAIR · axial · 5.0mm · 0.43mm/px · z∈[-49,+88]mm · 3 of 25 slices shown]
[im 1/25]
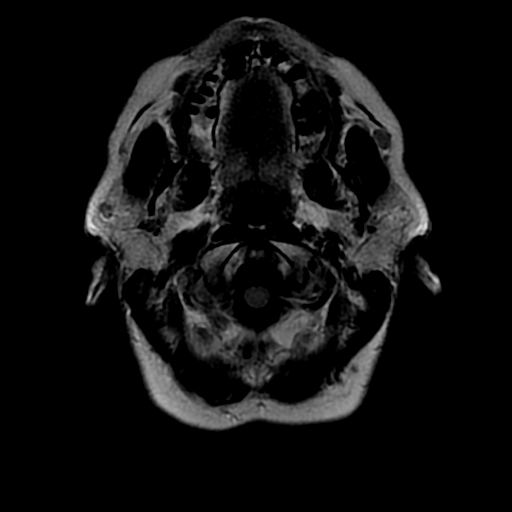
[im 13/25]
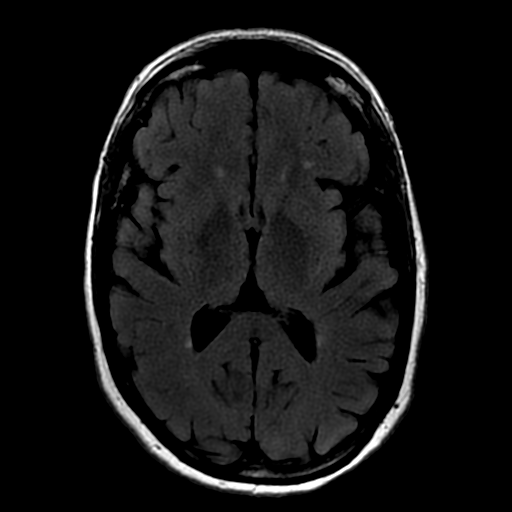
[im 25/25]
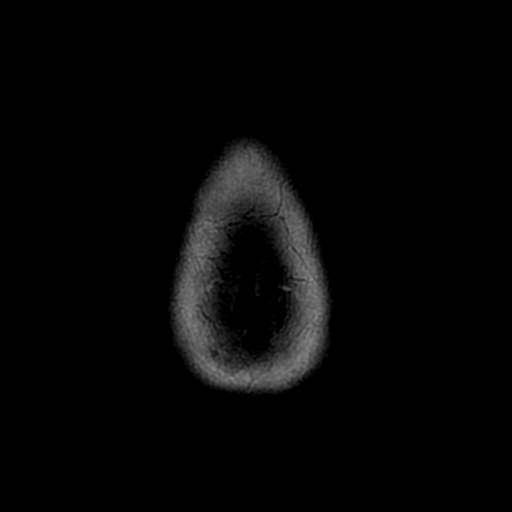

[Series 8: ax mpgr · axial · 5.0mm · 0.47mm/px · z∈[-52,+16]mm · 2 of 25 slices shown]
[im 1/25]
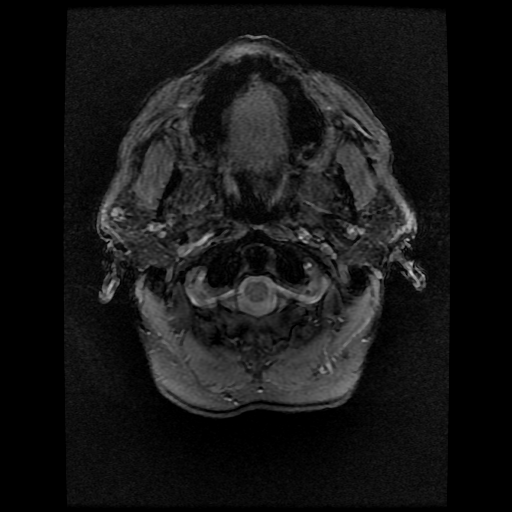
[im 13/25]
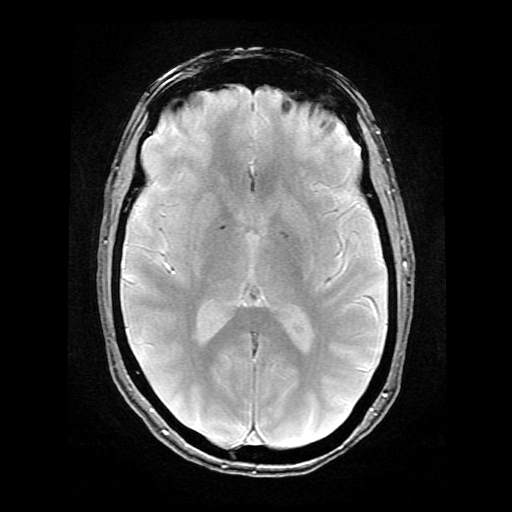

[Series 10: T2 · coronal · 5.0mm · 0.43mm/px · 3 of 31 slices shown (2 of 2)]
[im 1/31]
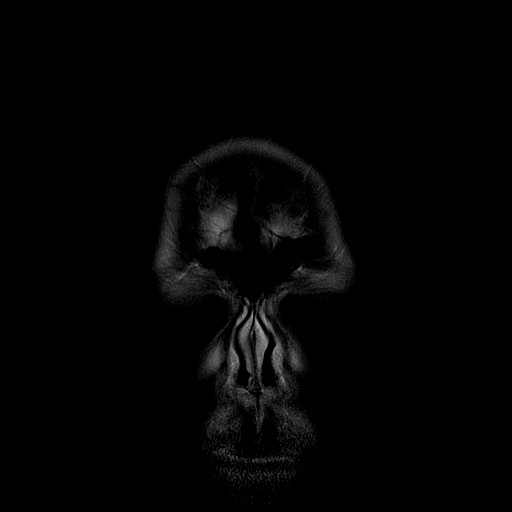
[im 16/31]
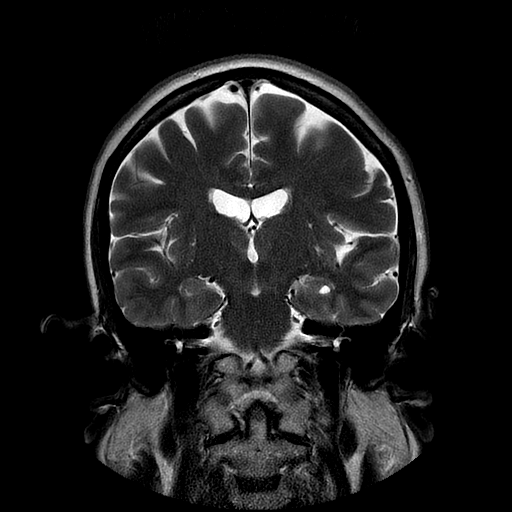
[im 31/31]
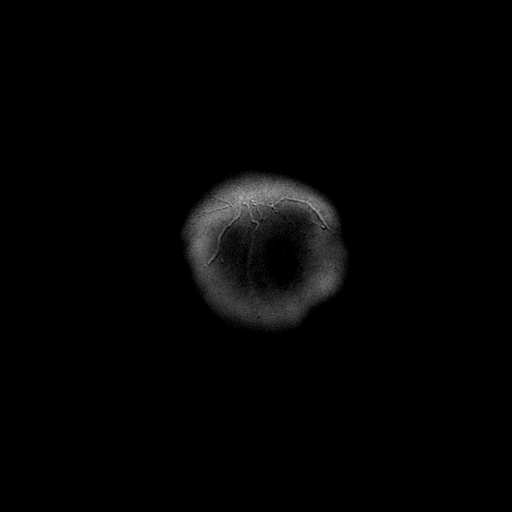

[Series 400: DWI · axial · 3.0mm · 1.09mm/px · z∈[-56,+77]mm · 5 of 47 slices shown (3 of 4)]
[im 1/47]
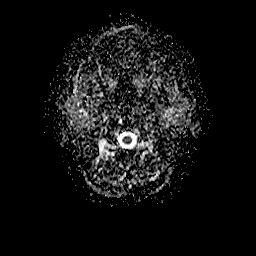
[im 12/47]
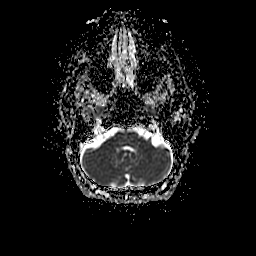
[im 24/47]
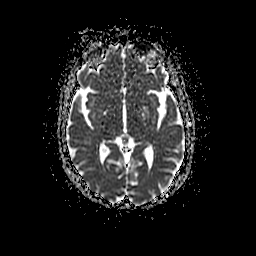
[im 35/47]
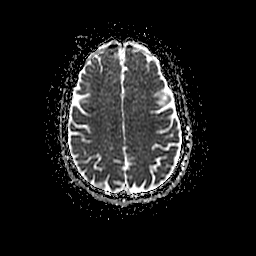
[im 47/47]
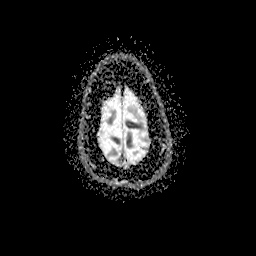

[Series 600: DWI · coronal · 5.0mm · 1.09mm/px · 3 of 33 slices shown (4 of 4)]
[im 1/33]
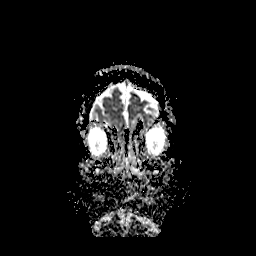
[im 17/33]
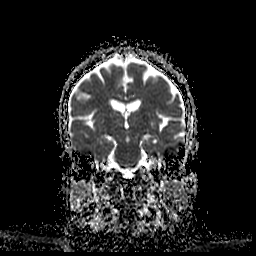
[im 33/33]
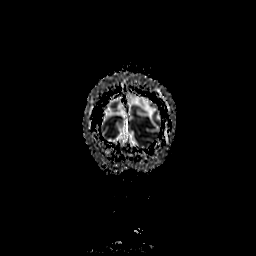

[35 of 48 positions shown; findings below may reference images not displayed]

FINDINGS: There is no evidence of acute infarct, intracranial hemorrhage,
mass, midline shift, or extra-axial fluid collection. The ventricles
and sulci are normal for age. Scattered punctate foci of T2
hyperintensity in the cerebral white matter are nonspecific but
compatible with minimal chronic small vessel ischemic disease. The
brainstem and cerebellum are unremarkable.

Orbits are unremarkable. Paranasal sinuses and mastoid air cells are
clear. There is a 3 mm T2 hyperintense focus in the pituitary gland
just left of midline. No bone marrow lesion.
IMPRESSION: 1. No acute intracranial abnormality.
2. Minimal chronic small vessel ischemic disease.
3. 3 mm lesion the pituitary gland, most likely an incidental and
inconsequential Rathke's cleft cyst. If there are clinical signs of
endocrine dysfunction, laboratory workup is recommended. Outpatient
pituitary protocol brain MRI could be performed if there are
laboratory abnormalities suggestive of a pituitary microadenoma or
other alternative diagnosis.

## 2017-08-22 DIAGNOSIS — Z1231 Encounter for screening mammogram for malignant neoplasm of breast: Secondary | ICD-10-CM | POA: Diagnosis not present

## 2017-08-22 DIAGNOSIS — Z803 Family history of malignant neoplasm of breast: Secondary | ICD-10-CM | POA: Diagnosis not present

## 2017-08-30 DIAGNOSIS — N3281 Overactive bladder: Secondary | ICD-10-CM | POA: Diagnosis not present

## 2017-08-30 DIAGNOSIS — I10 Essential (primary) hypertension: Secondary | ICD-10-CM | POA: Diagnosis not present

## 2018-01-31 DIAGNOSIS — Z23 Encounter for immunization: Secondary | ICD-10-CM | POA: Diagnosis not present

## 2018-02-16 DIAGNOSIS — Z23 Encounter for immunization: Secondary | ICD-10-CM | POA: Diagnosis not present

## 2018-02-22 DIAGNOSIS — E7800 Pure hypercholesterolemia, unspecified: Secondary | ICD-10-CM | POA: Diagnosis not present

## 2018-02-22 DIAGNOSIS — E669 Obesity, unspecified: Secondary | ICD-10-CM | POA: Diagnosis not present

## 2018-02-22 DIAGNOSIS — G4700 Insomnia, unspecified: Secondary | ICD-10-CM | POA: Diagnosis not present

## 2018-02-22 DIAGNOSIS — F419 Anxiety disorder, unspecified: Secondary | ICD-10-CM | POA: Diagnosis not present

## 2018-02-22 DIAGNOSIS — G2581 Restless legs syndrome: Secondary | ICD-10-CM | POA: Diagnosis not present

## 2018-02-22 DIAGNOSIS — Z6830 Body mass index (BMI) 30.0-30.9, adult: Secondary | ICD-10-CM | POA: Diagnosis not present

## 2018-02-22 DIAGNOSIS — N183 Chronic kidney disease, stage 3 (moderate): Secondary | ICD-10-CM | POA: Diagnosis not present

## 2018-02-22 DIAGNOSIS — I10 Essential (primary) hypertension: Secondary | ICD-10-CM | POA: Diagnosis not present

## 2018-02-22 DIAGNOSIS — N3281 Overactive bladder: Secondary | ICD-10-CM | POA: Diagnosis not present

## 2018-06-07 DIAGNOSIS — G2581 Restless legs syndrome: Secondary | ICD-10-CM | POA: Diagnosis not present

## 2018-06-07 DIAGNOSIS — I10 Essential (primary) hypertension: Secondary | ICD-10-CM | POA: Diagnosis not present

## 2018-06-07 DIAGNOSIS — F4321 Adjustment disorder with depressed mood: Secondary | ICD-10-CM | POA: Diagnosis not present

## 2018-06-07 DIAGNOSIS — G4700 Insomnia, unspecified: Secondary | ICD-10-CM | POA: Diagnosis not present

## 2018-06-07 DIAGNOSIS — N3281 Overactive bladder: Secondary | ICD-10-CM | POA: Diagnosis not present

## 2018-07-05 DIAGNOSIS — M7751 Other enthesopathy of right foot: Secondary | ICD-10-CM | POA: Diagnosis not present

## 2019-01-04 DIAGNOSIS — Z1389 Encounter for screening for other disorder: Secondary | ICD-10-CM | POA: Diagnosis not present

## 2019-01-04 DIAGNOSIS — Z0000 Encounter for general adult medical examination without abnormal findings: Secondary | ICD-10-CM | POA: Diagnosis not present

## 2019-01-04 DIAGNOSIS — G2581 Restless legs syndrome: Secondary | ICD-10-CM | POA: Diagnosis not present

## 2019-01-04 DIAGNOSIS — N183 Chronic kidney disease, stage 3 unspecified: Secondary | ICD-10-CM | POA: Diagnosis not present

## 2019-01-04 DIAGNOSIS — E7800 Pure hypercholesterolemia, unspecified: Secondary | ICD-10-CM | POA: Diagnosis not present

## 2019-01-04 DIAGNOSIS — G4700 Insomnia, unspecified: Secondary | ICD-10-CM | POA: Diagnosis not present

## 2019-01-04 DIAGNOSIS — E2839 Other primary ovarian failure: Secondary | ICD-10-CM | POA: Diagnosis not present

## 2019-01-04 DIAGNOSIS — F419 Anxiety disorder, unspecified: Secondary | ICD-10-CM | POA: Diagnosis not present

## 2019-01-04 DIAGNOSIS — Z1159 Encounter for screening for other viral diseases: Secondary | ICD-10-CM | POA: Diagnosis not present

## 2019-01-04 DIAGNOSIS — I10 Essential (primary) hypertension: Secondary | ICD-10-CM | POA: Diagnosis not present

## 2019-01-04 DIAGNOSIS — N3281 Overactive bladder: Secondary | ICD-10-CM | POA: Diagnosis not present

## 2019-01-04 DIAGNOSIS — Z23 Encounter for immunization: Secondary | ICD-10-CM | POA: Diagnosis not present

## 2019-01-04 DIAGNOSIS — F320 Major depressive disorder, single episode, mild: Secondary | ICD-10-CM | POA: Diagnosis not present

## 2019-01-30 DIAGNOSIS — E7800 Pure hypercholesterolemia, unspecified: Secondary | ICD-10-CM | POA: Diagnosis not present

## 2019-01-30 DIAGNOSIS — I10 Essential (primary) hypertension: Secondary | ICD-10-CM | POA: Diagnosis not present

## 2019-01-30 DIAGNOSIS — N183 Chronic kidney disease, stage 3 unspecified: Secondary | ICD-10-CM | POA: Diagnosis not present

## 2019-01-30 DIAGNOSIS — F4321 Adjustment disorder with depressed mood: Secondary | ICD-10-CM | POA: Diagnosis not present

## 2019-01-30 DIAGNOSIS — F320 Major depressive disorder, single episode, mild: Secondary | ICD-10-CM | POA: Diagnosis not present

## 2019-02-15 DIAGNOSIS — I10 Essential (primary) hypertension: Secondary | ICD-10-CM | POA: Diagnosis not present

## 2019-02-15 DIAGNOSIS — F320 Major depressive disorder, single episode, mild: Secondary | ICD-10-CM | POA: Diagnosis not present

## 2019-02-15 DIAGNOSIS — F4321 Adjustment disorder with depressed mood: Secondary | ICD-10-CM | POA: Diagnosis not present

## 2019-02-20 DIAGNOSIS — F4321 Adjustment disorder with depressed mood: Secondary | ICD-10-CM | POA: Diagnosis not present

## 2019-02-20 DIAGNOSIS — I10 Essential (primary) hypertension: Secondary | ICD-10-CM | POA: Diagnosis not present

## 2019-02-20 DIAGNOSIS — E7800 Pure hypercholesterolemia, unspecified: Secondary | ICD-10-CM | POA: Diagnosis not present

## 2019-02-20 DIAGNOSIS — F320 Major depressive disorder, single episode, mild: Secondary | ICD-10-CM | POA: Diagnosis not present

## 2019-04-11 DIAGNOSIS — E7800 Pure hypercholesterolemia, unspecified: Secondary | ICD-10-CM | POA: Diagnosis not present

## 2019-04-11 DIAGNOSIS — Z79899 Other long term (current) drug therapy: Secondary | ICD-10-CM | POA: Diagnosis not present

## 2019-05-30 DIAGNOSIS — F4321 Adjustment disorder with depressed mood: Secondary | ICD-10-CM | POA: Diagnosis not present

## 2019-05-30 DIAGNOSIS — I10 Essential (primary) hypertension: Secondary | ICD-10-CM | POA: Diagnosis not present

## 2019-05-30 DIAGNOSIS — E7800 Pure hypercholesterolemia, unspecified: Secondary | ICD-10-CM | POA: Diagnosis not present

## 2019-05-30 DIAGNOSIS — F320 Major depressive disorder, single episode, mild: Secondary | ICD-10-CM | POA: Diagnosis not present

## 2019-06-27 DIAGNOSIS — I10 Essential (primary) hypertension: Secondary | ICD-10-CM | POA: Diagnosis not present

## 2019-06-27 DIAGNOSIS — F320 Major depressive disorder, single episode, mild: Secondary | ICD-10-CM | POA: Diagnosis not present

## 2019-06-27 DIAGNOSIS — E7800 Pure hypercholesterolemia, unspecified: Secondary | ICD-10-CM | POA: Diagnosis not present

## 2019-06-27 DIAGNOSIS — N183 Chronic kidney disease, stage 3 unspecified: Secondary | ICD-10-CM | POA: Diagnosis not present

## 2019-06-27 DIAGNOSIS — G4700 Insomnia, unspecified: Secondary | ICD-10-CM | POA: Diagnosis not present

## 2019-06-27 DIAGNOSIS — F4321 Adjustment disorder with depressed mood: Secondary | ICD-10-CM | POA: Diagnosis not present

## 2019-07-31 DIAGNOSIS — Z23 Encounter for immunization: Secondary | ICD-10-CM | POA: Diagnosis not present

## 2019-08-21 DIAGNOSIS — Z23 Encounter for immunization: Secondary | ICD-10-CM | POA: Diagnosis not present

## 2019-08-28 DIAGNOSIS — F4321 Adjustment disorder with depressed mood: Secondary | ICD-10-CM | POA: Diagnosis not present

## 2019-08-28 DIAGNOSIS — G4700 Insomnia, unspecified: Secondary | ICD-10-CM | POA: Diagnosis not present

## 2019-08-28 DIAGNOSIS — N183 Chronic kidney disease, stage 3 unspecified: Secondary | ICD-10-CM | POA: Diagnosis not present

## 2019-08-28 DIAGNOSIS — F320 Major depressive disorder, single episode, mild: Secondary | ICD-10-CM | POA: Diagnosis not present

## 2019-08-28 DIAGNOSIS — I10 Essential (primary) hypertension: Secondary | ICD-10-CM | POA: Diagnosis not present

## 2019-08-28 DIAGNOSIS — E7800 Pure hypercholesterolemia, unspecified: Secondary | ICD-10-CM | POA: Diagnosis not present

## 2019-10-02 DIAGNOSIS — I10 Essential (primary) hypertension: Secondary | ICD-10-CM | POA: Diagnosis not present

## 2019-10-02 DIAGNOSIS — E7800 Pure hypercholesterolemia, unspecified: Secondary | ICD-10-CM | POA: Diagnosis not present

## 2019-10-02 DIAGNOSIS — F320 Major depressive disorder, single episode, mild: Secondary | ICD-10-CM | POA: Diagnosis not present

## 2019-10-02 DIAGNOSIS — G4700 Insomnia, unspecified: Secondary | ICD-10-CM | POA: Diagnosis not present

## 2019-10-02 DIAGNOSIS — N183 Chronic kidney disease, stage 3 unspecified: Secondary | ICD-10-CM | POA: Diagnosis not present

## 2019-10-02 DIAGNOSIS — F4321 Adjustment disorder with depressed mood: Secondary | ICD-10-CM | POA: Diagnosis not present

## 2019-10-09 DIAGNOSIS — N3281 Overactive bladder: Secondary | ICD-10-CM | POA: Diagnosis not present

## 2019-10-09 DIAGNOSIS — N1831 Chronic kidney disease, stage 3a: Secondary | ICD-10-CM | POA: Diagnosis not present

## 2019-10-09 DIAGNOSIS — E7800 Pure hypercholesterolemia, unspecified: Secondary | ICD-10-CM | POA: Diagnosis not present

## 2019-10-09 DIAGNOSIS — I10 Essential (primary) hypertension: Secondary | ICD-10-CM | POA: Diagnosis not present

## 2019-10-09 DIAGNOSIS — F320 Major depressive disorder, single episode, mild: Secondary | ICD-10-CM | POA: Diagnosis not present

## 2019-10-09 DIAGNOSIS — E669 Obesity, unspecified: Secondary | ICD-10-CM | POA: Diagnosis not present

## 2019-10-09 DIAGNOSIS — F419 Anxiety disorder, unspecified: Secondary | ICD-10-CM | POA: Diagnosis not present

## 2019-10-09 DIAGNOSIS — G2581 Restless legs syndrome: Secondary | ICD-10-CM | POA: Diagnosis not present

## 2020-01-22 DIAGNOSIS — F320 Major depressive disorder, single episode, mild: Secondary | ICD-10-CM | POA: Diagnosis not present

## 2020-01-22 DIAGNOSIS — G4700 Insomnia, unspecified: Secondary | ICD-10-CM | POA: Diagnosis not present

## 2020-01-22 DIAGNOSIS — N183 Chronic kidney disease, stage 3 unspecified: Secondary | ICD-10-CM | POA: Diagnosis not present

## 2020-01-22 DIAGNOSIS — F4321 Adjustment disorder with depressed mood: Secondary | ICD-10-CM | POA: Diagnosis not present

## 2020-01-22 DIAGNOSIS — I10 Essential (primary) hypertension: Secondary | ICD-10-CM | POA: Diagnosis not present

## 2020-01-22 DIAGNOSIS — E7800 Pure hypercholesterolemia, unspecified: Secondary | ICD-10-CM | POA: Diagnosis not present

## 2020-02-08 ENCOUNTER — Other Ambulatory Visit: Payer: Self-pay | Admitting: Family Medicine

## 2020-02-08 DIAGNOSIS — Z1231 Encounter for screening mammogram for malignant neoplasm of breast: Secondary | ICD-10-CM

## 2020-03-04 DIAGNOSIS — E7800 Pure hypercholesterolemia, unspecified: Secondary | ICD-10-CM | POA: Diagnosis not present

## 2020-03-04 DIAGNOSIS — F320 Major depressive disorder, single episode, mild: Secondary | ICD-10-CM | POA: Diagnosis not present

## 2020-03-04 DIAGNOSIS — N183 Chronic kidney disease, stage 3 unspecified: Secondary | ICD-10-CM | POA: Diagnosis not present

## 2020-03-04 DIAGNOSIS — F4321 Adjustment disorder with depressed mood: Secondary | ICD-10-CM | POA: Diagnosis not present

## 2020-03-04 DIAGNOSIS — I10 Essential (primary) hypertension: Secondary | ICD-10-CM | POA: Diagnosis not present

## 2020-03-04 DIAGNOSIS — G4700 Insomnia, unspecified: Secondary | ICD-10-CM | POA: Diagnosis not present

## 2020-03-19 ENCOUNTER — Ambulatory Visit: Payer: PRIVATE HEALTH INSURANCE

## 2020-03-20 ENCOUNTER — Other Ambulatory Visit: Payer: Self-pay

## 2020-03-20 ENCOUNTER — Ambulatory Visit
Admission: RE | Admit: 2020-03-20 | Discharge: 2020-03-20 | Disposition: A | Discharge status: Home / Self Care | Payer: Medicare Other | Source: Ambulatory Visit | Attending: Family Medicine | Admitting: Family Medicine

## 2020-03-20 DIAGNOSIS — Z1231 Encounter for screening mammogram for malignant neoplasm of breast: Secondary | ICD-10-CM | POA: Diagnosis not present

## 2020-05-29 DIAGNOSIS — N183 Chronic kidney disease, stage 3 unspecified: Secondary | ICD-10-CM | POA: Diagnosis not present

## 2020-05-29 DIAGNOSIS — I10 Essential (primary) hypertension: Secondary | ICD-10-CM | POA: Diagnosis not present

## 2020-05-29 DIAGNOSIS — N3281 Overactive bladder: Secondary | ICD-10-CM | POA: Diagnosis not present

## 2020-05-29 DIAGNOSIS — G4700 Insomnia, unspecified: Secondary | ICD-10-CM | POA: Diagnosis not present

## 2020-05-29 DIAGNOSIS — F419 Anxiety disorder, unspecified: Secondary | ICD-10-CM | POA: Diagnosis not present

## 2020-05-29 DIAGNOSIS — E7800 Pure hypercholesterolemia, unspecified: Secondary | ICD-10-CM | POA: Diagnosis not present

## 2020-05-29 DIAGNOSIS — G2581 Restless legs syndrome: Secondary | ICD-10-CM | POA: Diagnosis not present

## 2020-06-21 ENCOUNTER — Other Ambulatory Visit: Payer: Self-pay

## 2020-06-21 ENCOUNTER — Encounter (HOSPITAL_COMMUNITY): Payer: Self-pay

## 2020-06-21 ENCOUNTER — Emergency Department (HOSPITAL_COMMUNITY): Payer: Medicare Other

## 2020-06-21 ENCOUNTER — Emergency Department (HOSPITAL_COMMUNITY)
Admission: EM | Admit: 2020-06-21 | Discharge: 2020-06-21 | Disposition: A | Discharge status: Home / Self Care | Payer: Medicare Other | Attending: Emergency Medicine | Admitting: Emergency Medicine

## 2020-06-21 DIAGNOSIS — R7989 Other specified abnormal findings of blood chemistry: Secondary | ICD-10-CM | POA: Diagnosis not present

## 2020-06-21 DIAGNOSIS — I10 Essential (primary) hypertension: Secondary | ICD-10-CM | POA: Insufficient documentation

## 2020-06-21 DIAGNOSIS — K219 Gastro-esophageal reflux disease without esophagitis: Secondary | ICD-10-CM | POA: Insufficient documentation

## 2020-06-21 DIAGNOSIS — Z79899 Other long term (current) drug therapy: Secondary | ICD-10-CM | POA: Insufficient documentation

## 2020-06-21 DIAGNOSIS — Z7982 Long term (current) use of aspirin: Secondary | ICD-10-CM | POA: Diagnosis not present

## 2020-06-21 DIAGNOSIS — R079 Chest pain, unspecified: Secondary | ICD-10-CM | POA: Diagnosis not present

## 2020-06-21 DIAGNOSIS — R0789 Other chest pain: Secondary | ICD-10-CM | POA: Diagnosis not present

## 2020-06-21 LAB — BASIC METABOLIC PANEL
Anion gap: 11 (ref 5–15)
BUN: 13 mg/dL (ref 8–23)
CO2: 28 mmol/L (ref 22–32)
Calcium: 9.3 mg/dL (ref 8.9–10.3)
Chloride: 101 mmol/L (ref 98–111)
Creatinine, Ser: 0.92 mg/dL (ref 0.44–1.00)
GFR, Estimated: >60 mL/min (ref >60)
Glucose, Bld: 170 mg/dL — ABNORMAL HIGH (ref 70–99)
Potassium: 3.7 mmol/L (ref 3.5–5.1)
Sodium: 140 mmol/L (ref 135–145)

## 2020-06-21 LAB — CBC
HCT: 39.5 % (ref 36.0–46.0)
Hemoglobin: 13.5 g/dL (ref 12.0–15.0)
MCH: 33.6 pg (ref 26.0–34.0)
MCHC: 34.2 g/dL (ref 30.0–36.0)
MCV: 98.3 fL (ref 80.0–100.0)
Platelets: 282 10*3/uL (ref 150–400)
RBC: 4.02 MIL/uL (ref 3.87–5.11)
RDW: 11.9 % (ref 11.5–15.5)
WBC: 8.2 10*3/uL (ref 4.0–10.5)
nRBC: 0 % (ref 0.0–0.2)

## 2020-06-21 LAB — TROPONIN I (HIGH SENSITIVITY)
Troponin I (High Sensitivity): 4 ng/L (ref <18)
Troponin I (High Sensitivity): 5 ng/L (ref <18)

## 2020-06-21 LAB — D-DIMER, QUANTITATIVE: D-Dimer, Quant: 1.44 ug/mL-FEU — ABNORMAL HIGH (ref 0.00–0.50)

## 2020-06-21 MED ORDER — PANTOPRAZOLE SODIUM 20 MG PO TBEC: 20.0000 mg | DELAYED_RELEASE_TABLET | Freq: Every day | ORAL | 0 refills | Status: DC

## 2020-06-21 MED ORDER — ALUM & MAG HYDROXIDE-SIMETH 200-200-20 MG/5ML PO SUSP
30.0000 mL | Freq: Once | ORAL | Status: AC
  Administered 2020-06-21: 30 mL via ORAL
  Filled 2020-06-21: qty 30

## 2020-06-21 MED ORDER — LIDOCAINE VISCOUS HCL 2 % MT SOLN
15.0000 mL | Freq: Once | OROMUCOSAL | Status: AC
  Administered 2020-06-21: 15 mL via ORAL
  Filled 2020-06-21: qty 15

## 2020-06-21 MED ORDER — IOHEXOL 350 MG/ML SOLN
100.0000 mL | Freq: Once | INTRAVENOUS | Status: AC | PRN
  Administered 2020-06-21: 100 mL via INTRAVENOUS

## 2020-06-21 NOTE — Discharge Instructions (Signed)
Take the antacid medication as prescribed.  Avoid taking aspirin or ibuprofen.  Follow-up with your doctor if the symptoms are not improving in the week.  Return as needed for worsening symptoms

## 2020-06-21 NOTE — ED Triage Notes (Signed)
Pt presents with c/o chest pain that started last night. Pt reports she did have a bone graft/dental procedure approx one week ago. Pt reports that she has been taking ibuprofen, Tylenol, and hydrocodone for the pain, more than prescribed because she is in so much pain.

## 2020-06-21 NOTE — ED Provider Notes (Signed)
[facility] DEPT Provider Note   CSN: [id number] Arrival date & time: 06/21/20  1313     History Chief Complaint  Patient presents with  . Chest Pain    Linda Harvey is a 70 y.o. female.  HPI  HPI: A 70 year old patient with a history of hypertension and hypercholesterolemia presents for evaluation of chest pain. Initial onset of pain was more than 6 hours ago. The patient's chest pain is described as heaviness/pressure/tightness, is sharp and is not worse with exertion. The patient's chest pain is middle- or left-sided, is not well-localized and does not radiate to the arms/jaw/neck. The patient does not complain of nausea and denies diaphoresis. The patient has no history of stroke, has no history of peripheral artery disease, has not smoked in the past 90 days, denies any history of treated diabetes, has no relevant family history of coronary artery disease (first degree relative at less than age 73) and does not have an elevated BMI (>=30). Patient presented to the ED for evaluation of chest pain.  Patient states she had a dental procedure this past week.  They had to do a bone graft and took a portion of bone from her jaw.  Patient states she had been having a lot of pain this past week in her jaw.  That has actually gotten better at this time but throughout the week she was taking her prescribed hydrocodone as well as ibuprofen and Tylenol.  She started developing symptoms that she initially thought was acid reflux.  Was a burning in her chest.  Patient also found it harder to eat solid foods but is able to take softer foods without any difficulty.  The symptoms however progressed since last evening and the pain was more intense.  Patient does not have a history of heart disease.  No prior history of PE.  She denies any abdominal pain.  No vomiting or diarrhea.  Past Medical History:  Diagnosis Date  . Essential hypertension   . Heart murmur    per  patient report  . Hyperlipidemia   . Hypertension   . Insomnia   . Obesity (BMI 35.0-39.9 without comorbidity)   . Overactive bladder   . Severe Acute Febrile Illness as Child     Patient Active Problem List   Diagnosis Date Noted  . TIA (transient ischemic attack) 10/30/2015  . Sinus bradycardia 10/30/2015  . Dizziness   . Cephalalgia   . Chest pain with moderate risk for cardiac etiology 06/14/2014  . DOE (dyspnea on exertion) 06/14/2014  . Difficulty sleeping 06/14/2014  . Aortic valve disease - Systolic ejection murmur 36/14/4315  . Essential hypertension 06/14/2014  . Hyperlipidemia with target LDL less than 100 06/14/2014    Past Surgical History:  Procedure Laterality Date  . NM MYOVIEW LTD  06/25/14   Low Risk ST - normal perfusion, abnormal GXT, EF ~70%  . TRANSTHORACIC ECHOCARDIOGRAM  06/25/14    LV size & function - EF 55-60%, Gr 1 DD, mild-mod TR, Tr MR; Mild Aortic Sclerosis  . TUBAL LIGATION Bilateral      OB History   No obstetric history on file.     Family History  Problem Relation Age of Onset  . Diabetes Mother   . Cancer Father   . Diabetes Son   . Epilepsy Son   . Diabetes Sister     Social History   Tobacco Use  . Smoking status: Never Smoker  . Smokeless tobacco: Never Used  Substance Use Topics  . Alcohol use: Yes    Alcohol/week: 7.0 standard drinks    Types: 7 Glasses of wine per week    Comment: 1-2 glasses wine nightly per pt report  . Drug use: No    Home Medications Prior to Admission medications   Medication Sig Start Date End Date Taking? Authorizing Provider  pantoprazole (PROTONIX) 20 MG tablet Take 1 tablet (20 mg total) by mouth daily. 06/21/20  Yes Dorie Rank, MD  aspirin EC 81 MG tablet Take 81 mg by mouth daily.    [provider]  aspirin-acetaminophen-caffeine (EXCEDRIN MIGRAINE) [phone] MG tablet Take 2 tablets by mouth every 6 (six) hours as needed for headache.    [provider]  calcium  carbonate (OS-CAL - DOSED IN MG OF ELEMENTAL CALCIUM) 1250 (500 Ca) MG tablet Take 1 tablet by mouth daily with breakfast.    [provider]  cholecalciferol (VITAMIN D) 1000 units tablet Take 1,000 Units by mouth daily.    [provider]  cyclobenzaprine (FLEXERIL) 5 MG tablet Take 1 tablet (5 mg total) by mouth 3 (three) times daily. Patient not taking: Reported on 10/29/2015 12/31/14   Charlann Lange, PA-C  diphenhydramine-acetaminophen (TYLENOL PM) 25-500 MG TABS tablet Take 1 tablet by mouth at bedtime as needed (sleep).    [provider]  lisinopril-hydrochlorothiazide (PRINZIDE,ZESTORETIC) 20-25 MG per tablet Take 1 tablet by mouth daily. 07/11/14   [provider]  MELATONIN PO Take 1 tablet by mouth daily.     [provider]  Multiple Vitamin (MULTIVITAMIN WITH MINERALS) TABS tablet Take 1 tablet by mouth daily.    [provider]  NITROSTAT 0.4 MG SL tablet Place 0.4 mg under the tongue every 5 (five) minutes as needed.  06/11/14   [provider]  oxybutynin (DITROPAN-XL) 5 MG 24 hr tablet Take 5 mg by mouth at bedtime.    [provider]  simvastatin (ZOCOR) 20 MG tablet Take 20 mg by mouth daily.    [provider]  vitamin B-12 (CYANOCOBALAMIN) 1000 MCG tablet Take 1,000 mcg by mouth daily.    [provider]    Allergies    Patient has no known allergies.  Review of Systems   Review of Systems  Constitutional: Negative for fever.  Respiratory: Positive for shortness of breath.   Cardiovascular: Positive for chest pain.  Gastrointestinal: Negative for abdominal pain.  All other systems reviewed and are negative.   Physical Exam Updated Vital Signs BP (!) 161/72   Pulse (!) 59   Temp 97.7 F (36.5 C) (Oral)   Resp (!) 9   Ht 1.626 m (5\' 4" )   Wt 75.8 kg   SpO2 96%   BMI 28.67 kg/m   Physical Exam Vitals and nursing note reviewed.  Constitutional:      Appearance: She is  well-developed. She is not diaphoretic.  HENT:     Head: Normocephalic and atraumatic.     Right Ear: External ear normal.     Left Ear: External ear normal.  Eyes:     General: No scleral icterus.       Right eye: No discharge.        Left eye: No discharge.     Conjunctiva/sclera: Conjunctivae normal.  Neck:     Trachea: No tracheal deviation.  Cardiovascular:     Rate and Rhythm: Normal rate and regular rhythm.  Pulmonary:     Effort: Pulmonary effort is normal. No respiratory distress.  Breath sounds: Normal breath sounds. No stridor. No wheezing or rales.  Abdominal:     General: Bowel sounds are normal. There is no distension.     Palpations: Abdomen is soft.     Tenderness: There is no abdominal tenderness. There is no guarding or rebound.  Musculoskeletal:        General: No tenderness.     Cervical back: Neck supple.     Right lower leg: No tenderness.     Left lower leg: No tenderness.  Skin:    General: Skin is warm and dry.     Findings: No rash.  Neurological:     Mental Status: She is alert.     Cranial Nerves: No cranial nerve deficit (no facial droop, extraocular movements intact, no slurred speech).     Sensory: No sensory deficit.     Motor: No abnormal muscle tone or seizure activity.     Coordination: Coordination normal.     ED Results / Procedures / Treatments   Labs (all labs ordered are listed, but only abnormal results are displayed) Labs Reviewed  BASIC METABOLIC PANEL - Abnormal; Notable for the following components:      Result Value   Glucose, Bld 170 (*)    All other components within normal limits  D-DIMER, QUANTITATIVE - Abnormal; Notable for the following components:   D-Dimer, Quant 1.44 (*)    All other components within normal limits  CBC  TROPONIN I (HIGH SENSITIVITY)  TROPONIN I (HIGH SENSITIVITY)    EKG EKG Interpretation  Date/Time:  Saturday June 21 2020 13:22:52 EDT Ventricular Rate:  68 PR Interval:    QRS  Duration: 98 QT Interval:  422 QTC Calculation: 449 R Axis:   -147 Text Interpretation: Sinus rhythm Consider right ventricular hypertrophy Abnormal T, consider ischemia, lateral leads 12 Lead; Mason-Likar suspect limb lead reversal Confirmed by Dorie Rank [phone]) on 06/21/2020 1:29:24 PM   Radiology DG Chest 2 View  Result Date: 06/21/2020 CLINICAL DATA:  Chest pain. Additional provided: Patient reports chest pain which began last night EXAM: CHEST - 2 VIEW COMPARISON:  No pertinent prior exams available for comparison. FINDINGS: Heart size within normal limits. Aortic atherosclerosis. No appreciable airspace consolidation. No evidence of pleural effusion or pneumothorax. No acute bony abnormality identified. IMPRESSION: No evidence of acute cardiopulmonary abnormality. Aortic Atherosclerosis (ICD10-I70.0). Electronically Signed   By: Kellie Simmering DO   On: 06/21/2020 14:36   CT Angio Chest PE W and/or Wo Contrast  Result Date: 06/21/2020 CLINICAL DATA:  Chest pain, positive D-dimer EXAM: CT ANGIOGRAPHY CHEST WITH CONTRAST TECHNIQUE: Multidetector CT imaging of the chest was performed using the standard protocol during bolus administration of intravenous contrast. Multiplanar CT image reconstructions and MIPs were obtained to evaluate the vascular anatomy. CONTRAST:  147mL OMNIPAQUE IOHEXOL 350 MG/ML SOLN COMPARISON:  None. FINDINGS: Cardiovascular: Heart is borderline in size. Coronary artery and aortic calcifications. No aneurysm. No filling defects in the pulmonary arteries to suggest pulmonary emboli. Mediastinum/Nodes: No mediastinal, hilar, or axillary adenopathy. Trachea and esophagus are unremarkable. Thyroid unremarkable. Lungs/Pleura: Lungs are clear. No focal airspace opacities or suspicious nodules. No effusions. Upper Abdomen: Imaging into the upper abdomen demonstrates no acute findings. Musculoskeletal: Chest wall soft tissues are unremarkable. No acute bony abnormality. Review of the MIP  images confirms the above findings. IMPRESSION: No evidence of pulmonary embolus. Borderline heart size.  Coronary artery disease. Aortic Atherosclerosis (ICD10-I70.0). Electronically Signed   By: Rolm Baptise M.D.   On: 06/21/2020  19:33    Procedures Procedures   Medications Ordered in ED Medications  alum & mag hydroxide-simeth (MAALOX/MYLANTA) 200-200-20 MG/5ML suspension 30 mL (30 mLs Oral Given 06/21/20 1458)    And  lidocaine (XYLOCAINE) 2 % viscous mouth solution 15 mL (15 mLs Oral Given 06/21/20 1458)  iohexol (OMNIPAQUE) 350 MG/ML injection 100 mL (100 mLs Intravenous Contrast Given 06/21/20 1907)    ED Course  I have reviewed the triage vital signs and the nursing notes.  Pertinent labs & imaging results that were available during my care of the patient were reviewed by me and considered in my medical decision making (see chart for details).  Clinical Course as of 06/21/20 2022  Sat Jun 21, 2020  1522 Initial chest x-ray and heart enzyme normal.  CBC and metabolic panel unremarkable [JK]  1559 Serial troponins normal.  Patient does have an elevated D-dimer.  We will proceed with CT scan to rule out PE [JK]  1949 CT scan without acute finding [JK]    Clinical Course User Index [JK] Dorie Rank, MD   MDM Rules/Calculators/A&P HEAR Score: 4                        Patient presented to the ED for evaluation of chest pain.  Patient was having some symptoms concerning for possible esophagitis.  However she was also at risk for acute coronary syndrome and with her recent surgery pulmonary embolism is also a consideration.  Patient's D-dimer was positive so CT angiogram performed.  No signs of PE.  Serial troponins are normal.  Have low suspicion for ACS at this time.  Patient is feeling better.  Findings and plans were discussed.  Plan to discharge home with antacids. Final Clinical Impression(s) / ED Diagnoses Final diagnoses:  Chest pain, unspecified type  Gastroesophageal reflux  disease, unspecified whether esophagitis present    Rx / DC Orders ED Discharge Orders         Ordered    pantoprazole (PROTONIX) 20 MG tablet  Daily        06/21/20 2020           Dorie Rank, MD 06/21/20 2022

## 2021-01-06 DIAGNOSIS — I10 Essential (primary) hypertension: Secondary | ICD-10-CM | POA: Diagnosis not present

## 2021-01-06 DIAGNOSIS — Z1211 Encounter for screening for malignant neoplasm of colon: Secondary | ICD-10-CM | POA: Diagnosis not present

## 2021-01-06 DIAGNOSIS — N183 Chronic kidney disease, stage 3 unspecified: Secondary | ICD-10-CM | POA: Diagnosis not present

## 2021-01-06 DIAGNOSIS — Z23 Encounter for immunization: Secondary | ICD-10-CM | POA: Diagnosis not present

## 2021-01-06 DIAGNOSIS — E7800 Pure hypercholesterolemia, unspecified: Secondary | ICD-10-CM | POA: Diagnosis not present

## 2021-01-06 DIAGNOSIS — F419 Anxiety disorder, unspecified: Secondary | ICD-10-CM | POA: Diagnosis not present

## 2021-01-06 DIAGNOSIS — N3281 Overactive bladder: Secondary | ICD-10-CM | POA: Diagnosis not present

## 2021-02-12 DIAGNOSIS — F320 Major depressive disorder, single episode, mild: Secondary | ICD-10-CM | POA: Diagnosis not present

## 2021-02-12 DIAGNOSIS — G4700 Insomnia, unspecified: Secondary | ICD-10-CM | POA: Diagnosis not present

## 2021-02-12 DIAGNOSIS — E7800 Pure hypercholesterolemia, unspecified: Secondary | ICD-10-CM | POA: Diagnosis not present

## 2021-02-12 DIAGNOSIS — N183 Chronic kidney disease, stage 3 unspecified: Secondary | ICD-10-CM | POA: Diagnosis not present

## 2021-02-12 DIAGNOSIS — I10 Essential (primary) hypertension: Secondary | ICD-10-CM | POA: Diagnosis not present

## 2021-02-12 DIAGNOSIS — F4321 Adjustment disorder with depressed mood: Secondary | ICD-10-CM | POA: Diagnosis not present

## 2021-03-05 DIAGNOSIS — G4700 Insomnia, unspecified: Secondary | ICD-10-CM | POA: Diagnosis not present

## 2021-03-05 DIAGNOSIS — I10 Essential (primary) hypertension: Secondary | ICD-10-CM | POA: Diagnosis not present

## 2021-03-05 DIAGNOSIS — F320 Major depressive disorder, single episode, mild: Secondary | ICD-10-CM | POA: Diagnosis not present

## 2021-03-05 DIAGNOSIS — F4321 Adjustment disorder with depressed mood: Secondary | ICD-10-CM | POA: Diagnosis not present

## 2021-03-05 DIAGNOSIS — N183 Chronic kidney disease, stage 3 unspecified: Secondary | ICD-10-CM | POA: Diagnosis not present

## 2021-03-05 DIAGNOSIS — E7800 Pure hypercholesterolemia, unspecified: Secondary | ICD-10-CM | POA: Diagnosis not present

## 2021-06-11 DIAGNOSIS — D122 Benign neoplasm of ascending colon: Secondary | ICD-10-CM | POA: Diagnosis not present

## 2021-06-11 DIAGNOSIS — Z1211 Encounter for screening for malignant neoplasm of colon: Secondary | ICD-10-CM | POA: Diagnosis not present

## 2021-07-30 DIAGNOSIS — N183 Chronic kidney disease, stage 3 unspecified: Secondary | ICD-10-CM | POA: Diagnosis not present

## 2021-07-30 DIAGNOSIS — I10 Essential (primary) hypertension: Secondary | ICD-10-CM | POA: Diagnosis not present

## 2021-07-30 DIAGNOSIS — Z0000 Encounter for general adult medical examination without abnormal findings: Secondary | ICD-10-CM | POA: Diagnosis not present

## 2021-07-30 DIAGNOSIS — Z1331 Encounter for screening for depression: Secondary | ICD-10-CM | POA: Diagnosis not present

## 2021-07-30 DIAGNOSIS — E7800 Pure hypercholesterolemia, unspecified: Secondary | ICD-10-CM | POA: Diagnosis not present

## 2021-07-30 DIAGNOSIS — Z23 Encounter for immunization: Secondary | ICD-10-CM | POA: Diagnosis not present

## 2021-07-30 DIAGNOSIS — N3281 Overactive bladder: Secondary | ICD-10-CM | POA: Diagnosis not present

## 2021-07-30 DIAGNOSIS — F419 Anxiety disorder, unspecified: Secondary | ICD-10-CM | POA: Diagnosis not present

## 2021-11-26 DIAGNOSIS — Z1283 Encounter for screening for malignant neoplasm of skin: Secondary | ICD-10-CM | POA: Diagnosis not present

## 2021-11-26 DIAGNOSIS — C44519 Basal cell carcinoma of skin of other part of trunk: Secondary | ICD-10-CM | POA: Diagnosis not present

## 2021-11-26 DIAGNOSIS — D225 Melanocytic nevi of trunk: Secondary | ICD-10-CM | POA: Diagnosis not present

## 2021-12-11 DIAGNOSIS — C44519 Basal cell carcinoma of skin of other part of trunk: Secondary | ICD-10-CM | POA: Diagnosis not present

## 2021-12-17 ENCOUNTER — Emergency Department (HOSPITAL_COMMUNITY): Payer: Medicare Other

## 2021-12-17 ENCOUNTER — Emergency Department (HOSPITAL_COMMUNITY)
Admission: EM | Admit: 2021-12-17 | Discharge: 2021-12-18 | Disposition: A | Discharge status: Home / Self Care | Payer: Medicare Other | Attending: Medical | Admitting: Medical

## 2021-12-17 ENCOUNTER — Encounter (HOSPITAL_COMMUNITY): Payer: Self-pay

## 2021-12-17 DIAGNOSIS — Z79899 Other long term (current) drug therapy: Secondary | ICD-10-CM | POA: Diagnosis not present

## 2021-12-17 DIAGNOSIS — F331 Major depressive disorder, recurrent, moderate: Secondary | ICD-10-CM | POA: Diagnosis present

## 2021-12-17 DIAGNOSIS — I10 Essential (primary) hypertension: Secondary | ICD-10-CM | POA: Diagnosis not present

## 2021-12-17 DIAGNOSIS — Z20822 Contact with and (suspected) exposure to covid-19: Secondary | ICD-10-CM | POA: Diagnosis not present

## 2021-12-17 DIAGNOSIS — R4182 Altered mental status, unspecified: Secondary | ICD-10-CM | POA: Diagnosis not present

## 2021-12-17 DIAGNOSIS — R410 Disorientation, unspecified: Secondary | ICD-10-CM | POA: Diagnosis not present

## 2021-12-17 DIAGNOSIS — R45851 Suicidal ideations: Secondary | ICD-10-CM | POA: Diagnosis not present

## 2021-12-17 DIAGNOSIS — Z7982 Long term (current) use of aspirin: Secondary | ICD-10-CM | POA: Diagnosis not present

## 2021-12-17 DIAGNOSIS — F332 Major depressive disorder, recurrent severe without psychotic features: Secondary | ICD-10-CM | POA: Insufficient documentation

## 2021-12-17 DIAGNOSIS — F10929 Alcohol use, unspecified with intoxication, unspecified: Secondary | ICD-10-CM | POA: Diagnosis present

## 2021-12-17 DIAGNOSIS — N3000 Acute cystitis without hematuria: Secondary | ICD-10-CM | POA: Diagnosis not present

## 2021-12-17 DIAGNOSIS — R55 Syncope and collapse: Secondary | ICD-10-CM | POA: Diagnosis not present

## 2021-12-17 DIAGNOSIS — Z046 Encounter for general psychiatric examination, requested by authority: Secondary | ICD-10-CM | POA: Diagnosis present

## 2021-12-17 LAB — CBC WITH DIFFERENTIAL/PLATELET
Abs Immature Granulocytes: 0.01 10*3/uL (ref 0.00–0.07)
Basophils Absolute: 0.1 10*3/uL (ref 0.0–0.1)
Basophils Relative: 1 %
Eosinophils Absolute: 0.2 10*3/uL (ref 0.0–0.5)
Eosinophils Relative: 4 %
HCT: 37.2 % (ref 36.0–46.0)
Hemoglobin: 12.6 g/dL (ref 12.0–15.0)
Immature Granulocytes: 0 %
Lymphocytes Relative: 41 %
Lymphs Abs: 2 10*3/uL (ref 0.7–4.0)
MCH: 34.2 pg — ABNORMAL HIGH (ref 26.0–34.0)
MCHC: 33.9 g/dL (ref 30.0–36.0)
MCV: 101.1 fL — ABNORMAL HIGH (ref 80.0–100.0)
Monocytes Absolute: 0.6 10*3/uL (ref 0.1–1.0)
Monocytes Relative: 12 %
Neutro Abs: 2.1 10*3/uL (ref 1.7–7.7)
Neutrophils Relative %: 42 %
Platelets: 245 10*3/uL (ref 150–400)
RBC: 3.68 MIL/uL — ABNORMAL LOW (ref 3.87–5.11)
RDW: 11.7 % (ref 11.5–15.5)
WBC: 5 10*3/uL (ref 4.0–10.5)
nRBC: 0 % (ref 0.0–0.2)

## 2021-12-17 LAB — URINALYSIS, ROUTINE W REFLEX MICROSCOPIC
Bacteria, UA: NONE SEEN
Bilirubin Urine: NEGATIVE
Glucose, UA: NEGATIVE mg/dL
Hgb urine dipstick: NEGATIVE
Ketones, ur: NEGATIVE mg/dL
Leukocytes,Ua: NEGATIVE
Nitrite: POSITIVE — AB
Protein, ur: NEGATIVE mg/dL
Specific Gravity, Urine: 1.003 — ABNORMAL LOW (ref 1.005–1.030)
pH: 6 (ref 5.0–8.0)

## 2021-12-17 LAB — COMPREHENSIVE METABOLIC PANEL
ALT: 31 U/L (ref 0–44)
AST: 35 U/L (ref 15–41)
Albumin: 4 g/dL (ref 3.5–5.0)
Alkaline Phosphatase: 64 U/L (ref 38–126)
Anion gap: 5 (ref 5–15)
BUN: 16 mg/dL (ref 8–23)
CO2: 26 mmol/L (ref 22–32)
Calcium: 9 mg/dL (ref 8.9–10.3)
Chloride: 112 mmol/L — ABNORMAL HIGH (ref 98–111)
Creatinine, Ser: 0.85 mg/dL (ref 0.44–1.00)
GFR, Estimated: >60 mL/min (ref >60)
Glucose, Bld: 92 mg/dL (ref 70–99)
Potassium: 3.8 mmol/L (ref 3.5–5.1)
Sodium: 143 mmol/L (ref 135–145)
Total Bilirubin: 0.4 mg/dL (ref 0.3–1.2)
Total Protein: 6.8 g/dL (ref 6.5–8.1)

## 2021-12-17 LAB — SALICYLATE LEVEL: Salicylate Lvl: <7 mg/dL — ABNORMAL LOW (ref 7.0–30.0)

## 2021-12-17 LAB — ACETAMINOPHEN LEVEL: Acetaminophen (Tylenol), Serum: <10 ug/mL — ABNORMAL LOW (ref 10–30)

## 2021-12-17 LAB — RAPID URINE DRUG SCREEN, HOSP PERFORMED
Amphetamines: NOT DETECTED
Barbiturates: NOT DETECTED
Benzodiazepines: NOT DETECTED
Cocaine: NOT DETECTED
Opiates: NOT DETECTED
Tetrahydrocannabinol: NOT DETECTED

## 2021-12-17 LAB — RESP PANEL BY RT-PCR (FLU A&B, COVID) ARPGX2
Influenza A by PCR: NEGATIVE
Influenza B by PCR: NEGATIVE
SARS Coronavirus 2 by RT PCR: NEGATIVE

## 2021-12-17 LAB — ETHANOL: Alcohol, Ethyl (B): 186 mg/dL — ABNORMAL HIGH (ref <10)

## 2021-12-17 LAB — CBG MONITORING, ED: Glucose-Capillary: 84 mg/dL (ref 70–99)

## 2021-12-17 MED ORDER — SODIUM CHLORIDE 0.9 % IV SOLN
1.0000 g | Freq: Once | INTRAVENOUS | Status: AC
  Administered 2021-12-18: 1 g via INTRAVENOUS
  Filled 2021-12-17: qty 10

## 2021-12-17 NOTE — ED Notes (Signed)
Pt reports that she was at the [employer] store earlier today, seen a man that resemble her son, she called to him by name, tried to follow him, but the man ignored her and left in her car. Pt st "I know that was my son, it was my son, he just drive off from me". Pt st she just wants her son, she was upset so she drank wine and wanted to relaxin the bath tub.

## 2021-12-17 NOTE — ED Provider Notes (Signed)
[facility] DEPT Provider Note   CSN: [id number] Arrival date & time: 12/17/21  2155     History {Add pertinent medical, surgical, social history, OB history to HPI:1} Chief Complaint  Patient presents with   Suicidal    Linda Harvey is a 71 y.o. female, hx of hypertension, hyperlipidemia, here for suicide attempt.  Per patient her son died about 4 years ago, and she thought that she saw him at the [employer] today.  She was so depressed because her son has been dead and all she wants to do is be with him, that when she got home she drank 2 glasses of wine, and then took a bath, and attempted to drown herself in the bathtub.  She states she was fine found by her husband Ronalee Belts, and brought to the hospital.  She endorses suicidal ideation, intent.  Denies homicidal ideation intent.  Denies auditory or visual hallucinations.   Denies any blood thinner use.  HPI     Home Medications Prior to Admission medications   Medication Sig Start Date End Date Taking? Authorizing Provider  aspirin EC 81 MG tablet Take 81 mg by mouth daily.    [provider]  aspirin-acetaminophen-caffeine (EXCEDRIN MIGRAINE) [phone] MG tablet Take 2 tablets by mouth every 6 (six) hours as needed for headache.    [provider]  calcium carbonate (OS-CAL - DOSED IN MG OF ELEMENTAL CALCIUM) 1250 (500 Ca) MG tablet Take 1 tablet by mouth daily with breakfast.    [provider]  cholecalciferol (VITAMIN D) 1000 units tablet Take 1,000 Units by mouth daily.    [provider]  cyclobenzaprine (FLEXERIL) 5 MG tablet Take 1 tablet (5 mg total) by mouth 3 (three) times daily. Patient not taking: Reported on 10/29/2015 12/31/14   Charlann Lange, PA-C  diphenhydramine-acetaminophen (TYLENOL PM) 25-500 MG TABS tablet Take 1 tablet by mouth at bedtime as needed (sleep).    [provider]  lisinopril-hydrochlorothiazide (PRINZIDE,ZESTORETIC)  20-25 MG per tablet Take 1 tablet by mouth daily. 07/11/14   [provider]  MELATONIN PO Take 1 tablet by mouth daily.     [provider]  Multiple Vitamin (MULTIVITAMIN WITH MINERALS) TABS tablet Take 1 tablet by mouth daily.    [provider]  NITROSTAT 0.4 MG SL tablet Place 0.4 mg under the tongue every 5 (five) minutes as needed.  06/11/14   [provider]  oxybutynin (DITROPAN-XL) 5 MG 24 hr tablet Take 5 mg by mouth at bedtime.    [provider]  pantoprazole (PROTONIX) 20 MG tablet Take 1 tablet (20 mg total) by mouth daily. 06/21/20   Dorie Rank, MD  simvastatin (ZOCOR) 20 MG tablet Take 20 mg by mouth daily.    [provider]  vitamin B-12 (CYANOCOBALAMIN) 1000 MCG tablet Take 1,000 mcg by mouth daily.    [provider]      Allergies    Patient has no known allergies.    Review of Systems   Review of Systems  Psychiatric/Behavioral:  Positive for suicidal ideas. The patient is nervous/anxious.     Physical Exam Updated Vital Signs BP (!) 153/112   Pulse 73   Temp (!) 97.5 F (36.4 C) (Oral)   Resp 19   Ht '5\' 3"'$  (1.6 m)   Wt 72.6 kg   SpO2 97%   BMI 28.34 kg/m  Physical Exam Vitals and nursing note reviewed.  Constitutional:      Appearance: Normal  appearance.  HENT:     Head: Normocephalic and atraumatic.     Right Ear: Tympanic membrane normal.     Left Ear: Tympanic membrane normal.     Nose: Nose normal.     Mouth/Throat:     Mouth: Mucous membranes are moist.  Eyes:     Extraocular Movements: Extraocular movements intact.     Conjunctiva/sclera: Conjunctivae normal.     Pupils: Pupils are equal, round, and reactive to light.  Cardiovascular:     Rate and Rhythm: Normal rate and regular rhythm.  Pulmonary:     Effort: Pulmonary effort is normal.     Breath sounds: Normal breath sounds.  Abdominal:     General: Abdomen is flat. Bowel sounds are normal.     Palpations: Abdomen is soft.   Musculoskeletal:        General: Normal range of motion.     Cervical back: Normal range of motion and neck supple.  Skin:    General: Skin is warm and dry.     Capillary Refill: Capillary refill takes less than 2 seconds.  Neurological:     General: No focal deficit present.     Mental Status: She is alert.  Psychiatric:        Attention and Perception: She is inattentive.        Mood and Affect: Mood is anxious. Affect is labile and tearful.        Speech: Speech is rapid and pressured.        Behavior: Behavior is uncooperative.     ED Results / Procedures / Treatments   Labs (all labs ordered are listed, but only abnormal results are displayed) Labs Reviewed  RESP PANEL BY RT-PCR (FLU A&B, COVID) [id number]  COMPREHENSIVE METABOLIC PANEL  ETHANOL  RAPID URINE DRUG SCREEN, HOSP PERFORMED  CBC WITH DIFFERENTIAL/PLATELET  URINALYSIS, ROUTINE W REFLEX MICROSCOPIC  ACETAMINOPHEN LEVEL  SALICYLATE LEVEL  CBG MONITORING, ED    EKG NSR; Qtc 416m  Radiology No results found.  Procedures Procedures  {Document cardiac monitor, telemetry assessment procedure when appropriate:1}  Medications Ordered in ED Medications - No data to display  ED Course/ Medical Decision Making/ A&P                           Medical Decision Making Amount and/or Complexity of Data Reviewed Labs: ordered. Radiology: ordered.   ***  {Document critical care time when appropriate:1} {Document review of labs and clinical decision tools ie heart score, Chads2Vasc2 etc:1}  {Document your independent review of radiology images, and any outside records:1} {Document your discussion with family members, caretakers, and with consultants:1} {Document social determinants of health affecting pt's care:1} {Document your decision making why or why not admission, treatments were needed:1} Final Clinical Impression(s) / ED Diagnoses Final diagnoses:  None    Rx / DC Orders ED Discharge Orders      None

## 2021-12-17 NOTE — ED Triage Notes (Addendum)
Pt from home BIB [facility], husband found pt unresponsive in bath tube, head above water, breathing but not alert. Husband reported pt had been drinking wine tonight. EMS reports pt responded with sternal rub. Tearful, reports pt "just wants to go see my son". Son is deceased. VSS per EMS. Pt repeating "just let me die", pt crying at current.

## 2021-12-18 DIAGNOSIS — F332 Major depressive disorder, recurrent severe without psychotic features: Secondary | ICD-10-CM | POA: Diagnosis not present

## 2021-12-18 DIAGNOSIS — N3000 Acute cystitis without hematuria: Secondary | ICD-10-CM | POA: Diagnosis not present

## 2021-12-18 DIAGNOSIS — F331 Major depressive disorder, recurrent, moderate: Secondary | ICD-10-CM | POA: Diagnosis present

## 2021-12-18 DIAGNOSIS — I10 Essential (primary) hypertension: Secondary | ICD-10-CM | POA: Diagnosis not present

## 2021-12-18 DIAGNOSIS — F10929 Alcohol use, unspecified with intoxication, unspecified: Secondary | ICD-10-CM | POA: Diagnosis present

## 2021-12-18 DIAGNOSIS — R45851 Suicidal ideations: Secondary | ICD-10-CM | POA: Diagnosis not present

## 2021-12-18 MED ORDER — ROSUVASTATIN CALCIUM 20 MG PO TABS: 20.0000 mg | ORAL_TABLET | Freq: Every day | ORAL | Status: DC

## 2021-12-18 MED ORDER — CLONAZEPAM 0.5 MG PO TABS: 0.5000 mg | ORAL_TABLET | Freq: Every day | ORAL | Status: DC

## 2021-12-18 MED ORDER — AMLODIPINE BESYLATE 5 MG PO TABS
5.0000 mg | ORAL_TABLET | Freq: Every day | ORAL | Status: DC
  Administered 2021-12-18: 5 mg via ORAL
  Filled 2021-12-18: qty 1

## 2021-12-18 MED ORDER — MELATONIN 3 MG PO TABS: 3.0000 mg | ORAL_TABLET | Freq: Every day | ORAL | Status: DC

## 2021-12-18 MED ORDER — BUPROPION HCL ER (XL) 150 MG PO TB24
150.0000 mg | ORAL_TABLET | Freq: Every morning | ORAL | Status: DC
  Administered 2021-12-18: 150 mg via ORAL
  Filled 2021-12-18: qty 1

## 2021-12-18 MED ORDER — OXYBUTYNIN CHLORIDE ER 15 MG PO TB24
30.0000 mg | ORAL_TABLET | Freq: Every day | ORAL | Status: DC
  Administered 2021-12-18: 30 mg via ORAL
  Filled 2021-12-18: qty 2

## 2021-12-18 MED ORDER — LISINOPRIL 10 MG PO TABS
20.0000 mg | ORAL_TABLET | Freq: Every day | ORAL | Status: DC
  Administered 2021-12-18: 20 mg via ORAL
  Filled 2021-12-18: qty 2

## 2021-12-18 MED ORDER — ASPIRIN 81 MG PO TBEC
81.0000 mg | DELAYED_RELEASE_TABLET | Freq: Every day | ORAL | Status: DC
  Administered 2021-12-18: 81 mg via ORAL
  Filled 2021-12-18: qty 1

## 2021-12-18 NOTE — ED Provider Notes (Signed)
Emergency Medicine Observation Re-evaluation Note  Linda Harvey is a 71 y.o. female, seen on rounds today.  Pt initially presented to the ED for complaints of Suicidal Currently, the patient is resting quietly.  Physical Exam  BP (!) 144/82   Pulse (!) 55   Temp 98.1 F (36.7 C) (Oral)   Resp 16   Ht '5\' 3"'$  (1.6 m)   Wt 72.6 kg   SpO2 97%   BMI 28.34 kg/m  Physical Exam General: No acute distress Cardiac: Well-perfused Lungs: Nonlabored Psych: Cooperative  ED Course / MDM  EKG:   I have reviewed the labs performed to date as well as medications administered while in observation.  Recent changes in the last 24 hours include psychiatric evaluation.  Plan  Current plan is for reassessment this morning.  1330.  Patient has been psychiatrically cleared and they have put some resources in for outpatient follow-up.  Will review with patient. Patient is on board with plan and husband is here and feels safe with her going home.   Hayden Rasmussen, MD 12/18/21 [phone]

## 2021-12-18 NOTE — ED Notes (Signed)
Pt awake, sitting up in bed watching TV, blanket on pt for warmth and comfort, lights off to room to help induce some rest for pt, NAD noted, sitter within view of pt for safety, room secured, plan of care on going, no further concerns as of present

## 2021-12-18 NOTE — ED Notes (Signed)
TTS completed. Evette Georges, NP recommends Pt be evaluated by psychiatry later this morning

## 2021-12-18 NOTE — Discharge Summary (Signed)
[facility] Psych ED Discharge  12/18/2021 1:04 PM Rayya Yagi  MRN:  [id number]  Principal Problem: Alcohol intoxication in episodic drinker [facility]) Discharge Diagnoses: Principal Problem:   Alcohol intoxication in episodic drinker ([place]) Active Problems:   MDD (major depressive disorder), recurrent episode, moderate ([place])  Clinical Impression:  Final diagnoses:  Acute cystitis without hematuria  MDD (major depressive disorder), recurrent episode, moderate (HCC)   Subjective:  Patient reports that yesterday she was at the [employer] store and she saw a man that looked very similar to her deceased son.  She states that her son died approximately 3 years ago.  She states that he had epilepsy and was not compliant with medications and treatment.  She states that 1 day he walked outside and passed away in the front yard.  She states that this triggered her to go home and drink wine.  She states that she "gulped" 2 glasses of wine and then got in the bathtub and fell asleep.  She denies that she was trying to kill herself.  She denies a history of suicide attempts.  She denies a history of self-harming behaviors.  Patient states "I would never kill myself.  I do not want to die."  She states that she has religious and would not want to risk not going to heaven.  She states that she has a wonderful relationship with her husband, daughter, and 2 grandchildren.  She states that she and her husband love traveling and going hiking.  She states that they work out together.  She states that she goes to the [facility] frequently where she has a group of friends that she works out with.  Patient states that her life has improved since her son passed away.  She states that prior to him passing away she was constantly worried about him and she had difficulty living her life and traveling because she was always having to check on him.  States that her relationship with her daughter, husband, and 2 grandchildren have improved since  her son passed away because she has more time to spend with them.   Patient reports that she does sometimes binge drink wine.  She states that she will go several weeks without drinking wine.  And then she will drink several glasses at one time.  She states that she realizes this is an issue.   Patient reports that she is open to receiving therapy and psychiatric treatment for medication management.  Met with patient and her husband later in the day.  Patient's husband states that he has no concerns for the patient's safety.  He states that he does not believe his wife attempted to kill herself.  He thinks that she was intoxicated and fell asleep in the bathtub.  He states that he contacted EMS because he had difficulty arousing her.  He states that he does not think that his wife ingested anything other than wine.  He states that he noticed she had not eaten all day because they had lunch and dinner together.  He feels that her drinking a large amount of wine at one time combined with her not eating contributed to her drowsiness.  He states that his nausea she has never attempted suicide before he states that he has taken the next week off from work and will be with his wife at home.  We discussed methods to reduce the risk of self-injury or suicide attempts: Frequent conversations regarding unsafe thoughts. Remove all significant sharps. Remove all firearms.  Remove all medications, including over-the-counter meds. Consider lockbox for medications and having a responsible person dispense medications until patient has strengthened coping skills. Room checks for sharps or other harmful objects. Secure all chemical substances that can be ingested or inhaled.   We discussed refrain from using alcohol or illicit substances, as they can affect mood and can cause depression, anxiety or other concerning symptoms. Alcohol can increase the chance that a person will make reckless decisions, like attempting suicide,  and can increase the lethality of a drug overdose.    ED Assessment Time Calculation: Start Time: 0930 Stop Time: 1000 Total Time in Minutes (Assessment Completion): 30   Past Psychiatric History: Depression.  Denies previous history of suicide attempts.  Denies a history of inpatient psychiatric admissions.  Denies a history of receiving psychiatric treatment or therapy.  Past Medical History:  Past Medical History:  Diagnosis Date   Essential hypertension    Heart murmur    per patient report   Hyperlipidemia    Hypertension    Insomnia    Obesity (BMI 35.0-39.9 without comorbidity)    Overactive bladder    Severe Acute Febrile Illness as Child     Past Surgical History:  Procedure Laterality Date   NM MYOVIEW LTD  06/25/14   Low Risk ST - normal perfusion, abnormal GXT, EF ~70%   TRANSTHORACIC ECHOCARDIOGRAM  06/25/14    LV size & function - EF 55-60%, Gr 1 DD, mild-mod TR, Tr MR; Mild Aortic Sclerosis   TUBAL LIGATION Bilateral    Family History:  Family History  Problem Relation Age of Onset   Diabetes Mother    Cancer Father    Diabetes Son    Epilepsy Son    Diabetes Sister    Family Psychiatric  History: Denies significant family history of mental illness. Social History:  Social History   Substance and Sexual Activity  Alcohol Use Yes   Alcohol/week: 7.0 standard drinks of alcohol   Types: 7 Glasses of wine per week   Comment: 1-2 glasses wine nightly per pt report     Social History   Substance and Sexual Activity  Drug Use No    Social History   Socioeconomic History   Marital status: Married    Spouse name: Not on file   Number of children: Not on file   Years of education: Not on file   Highest education level: Not on file  Occupational History   Not on file  Tobacco Use   Smoking status: Never   Smokeless tobacco: Never  Vaping Use   Vaping Use: Never used  Substance and Sexual Activity   Alcohol use: Yes    Alcohol/week: 7.0  standard drinks of alcohol    Types: 7 Glasses of wine per week    Comment: 1-2 glasses wine nightly per pt report   Drug use: No   Sexual activity: Yes    Birth control/protection: None  Other Topics Concern   Not on file  Social History Narrative   Lives with he husband - likes to go walking with him   Notes that has not had any "sex drive" x ~2 yrs.   Never smoked.  No EtOH.    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   Social Determinants of Health   Financial Resource Strain: Not on file  Food Insecurity: Not on file  Transportation Needs: Not on file  Physical Activity: Not on file  Stress: Not on file  Social Connections:  Not on file    Tobacco Cessation:  A prescription for an FDA-approved tobacco cessation medication was offered at discharge and the patient refused  Current Medications: Current Facility-Administered Medications  Medication Dose Route Frequency Provider Last Rate Last Admin   amLODipine (NORVASC) tablet 5 mg  5 mg Oral Daily Small, Brooke L, PA   5 mg at 12/18/21 0949   aspirin EC tablet 81 mg  81 mg Oral Daily Small, Brooke L, PA   81 mg at 12/18/21 0949   buPROPion (WELLBUTRIN XL) 24 hr tablet 150 mg  150 mg Oral q morning Small, Brooke L, PA   150 mg at 12/18/21 0949   clonazePAM (KLONOPIN) tablet 0.5 mg  0.5 mg Oral QHS Small, Brooke L, PA       lisinopril (ZESTRIL) tablet 20 mg  20 mg Oral Daily Small, Brooke L, PA   20 mg at 12/18/21 0949   melatonin tablet 3 mg  3 mg Oral QHS Small, Brooke L, PA       oxybutynin (DITROPAN XL) 24 hr tablet 30 mg  30 mg Oral Daily Small, Brooke L, PA   30 mg at 12/18/21 0949   rosuvastatin (CRESTOR) tablet 20 mg  20 mg Oral QHS Small, Brooke L, PA       Current Outpatient Medications  Medication Sig Dispense Refill   amLODipine (NORVASC) 5 MG tablet Take 5 mg by mouth daily.     aspirin EC 81 MG tablet Take 81 mg by mouth daily.     aspirin-acetaminophen-caffeine (EXCEDRIN MIGRAINE) 250-250-65 MG tablet Take 2 tablets  by mouth every 6 (six) hours as needed for headache.     buPROPion (WELLBUTRIN XL) 150 MG 24 hr tablet Take 150 mg by mouth every morning.     clonazePAM (KLONOPIN) 0.5 MG tablet Take 0.5 mg by mouth at bedtime.     diphenhydramine-acetaminophen (TYLENOL PM) 25-500 MG TABS tablet Take 1 tablet by mouth at bedtime.     lisinopril (ZESTRIL) 20 MG tablet Take 20 mg by mouth daily.     MELATONIN PO Take 1 tablet by mouth at bedtime.     Multiple Vitamin (MULTIVITAMIN WITH MINERALS) TABS tablet Take 1 tablet by mouth daily.     NITROSTAT 0.4 MG SL tablet Place 0.4 mg under the tongue every 5 (five) minutes as needed for chest pain.     oxybutynin (DITROPAN XL) 15 MG 24 hr tablet Take 30 mg by mouth daily.     rosuvastatin (CRESTOR) 20 MG tablet Take 20 mg by mouth at bedtime.     cyclobenzaprine (FLEXERIL) 5 MG tablet Take 1 tablet (5 mg total) by mouth 3 (three) times daily. (Patient not taking: Reported on 10/29/2015) 15 tablet 0   pantoprazole (PROTONIX) 20 MG tablet Take 1 tablet (20 mg total) by mouth daily. (Patient not taking: Reported on 12/18/2021) 30 tablet 0   PTA Medications: (Not in a hospital admission)   [place] Scale:  [place] ED from 12/17/2021 in [place] DEPT ED from 06/21/2020 in [place] DEPT  C-SSRS RISK CATEGORY High Risk No Risk       Musculoskeletal: Strength & Muscle Tone: within normal limits Gait & Station: normal Patient leans: N/A  Psychiatric Specialty Exam: Presentation  General Appearance: Appropriate for Environment; Fairly Groomed  Eye Contact:Good  Speech:Clear and Coherent; Normal Rate  Speech Volume:Normal  Handedness:No data recorded  Mood and Affect  Mood:Euthymic  Affect:Congruent   Thought Process  Thought Processes:Coherent; Goal Directed; Linear  Descriptions of Associations:Intact  Orientation:Full (Time, Place and Person)  Thought Content:Logical  History  of Schizophrenia/Schizoaffective disorder:No  Duration of Psychotic Symptoms:No data recorded Hallucinations:Hallucinations: None  Ideas of Reference:None  Suicidal Thoughts:Suicidal Thoughts: No  Homicidal Thoughts:Homicidal Thoughts: No   Sensorium  Memory:Immediate Good; Recent Good; Remote Good  Judgment:Fair  Insight:Fair   Executive Functions  Concentration:Good  Attention Span:Good  [place] of Knowledge:Good  Language:Good   Psychomotor Activity  Psychomotor Activity:Psychomotor Activity: Normal   Assets  Assets:Communication Skills; Desire for Improvement; Physical Health; Social Support; Catering manager; Housing; Transportation; Leisure Time; Resilience   Sleep  Sleep:Sleep: Fair    Physical Exam: Physical Exam Constitutional:      General: She is not in acute distress.    Appearance: She is not ill-appearing, toxic-appearing or diaphoretic.  HENT:     Right Ear: External ear normal.     Left Ear: External ear normal.  Eyes:     General:        Right eye: No discharge.        Left eye: No discharge.  Cardiovascular:     Rate and Rhythm: Normal rate.  Pulmonary:     Effort: Pulmonary effort is normal. No respiratory distress.  Musculoskeletal:        General: Normal range of motion.     Cervical back: Normal range of motion.  Neurological:     Mental Status: She is alert and oriented to person, place, and time.  Psychiatric:        Mood and Affect: Mood is not anxious or depressed.        Behavior: Behavior is cooperative.        Thought Content: Thought content is not paranoid. Thought content does not include homicidal or suicidal ideation.    Review of Systems  Constitutional:  Negative for chills, diaphoresis, fever, malaise/fatigue and weight loss.  HENT:  Negative for congestion.   Respiratory:  Negative for cough and shortness of breath.   Cardiovascular:  Negative for chest pain and palpitations.   Gastrointestinal:  Negative for diarrhea, nausea and vomiting.  Neurological:  Negative for dizziness and seizures.  Psychiatric/Behavioral:  Positive for substance abuse. Negative for depression, hallucinations, memory loss and suicidal ideas. The patient is not nervous/anxious and does not have insomnia.   All other systems reviewed and are negative.  Blood pressure (!) 144/82, pulse (!) 55, temperature 98.1 F (36.7 C), temperature source Oral, resp. rate 16, height 5' 3"  (1.6 m), weight 72.6 kg, SpO2 97 %. Body mass index is 28.34 kg/m.   Demographic Factors:  Age 89 or older, Caucasian, and Unemployed  Loss Factors: Loss of significant relationship  Historical Factors: NA  Risk Reduction Factors:   Sense of responsibility to family, Religious beliefs about death, Living with another person, especially a relative, and Positive social support  Continued Clinical Symptoms:  Alcohol/Substance Abuse/Dependencies  Cognitive Features That Contribute To Risk:  None    Suicide Risk:  Minimal: No identifiable suicidal ideation.  Patients presenting with no risk factors but with morbid ruminations; may be classified as minimal risk based on the severity of the depressive symptoms  Medical Decision Making: Patient reports that yesterday she was at the [place] store and she saw a man that looked very similar to her deceased son.  She states that her son died approximately 3 years ago.  She states that he had epilepsy and was not compliant with medications and treatment.  She states that 1 day he walked outside and passed away in the front yard.  She states that this triggered her to go home and drink wine.  She states that she "gulped" 2 glasses of wine and then got in the bathtub and fell asleep.  She denies that she was trying to kill herself.  She denies a history of suicide attempts.  She denies a history of self-harming behaviors.  Patient states "I would never kill myself.  I do not  want to die."  She states that she has religious and would not want to risk not going to heaven.  She states that she has a wonderful relationship with her husband, daughter, and 2 grandchildren.  She states that she and her husband love traveling and going hiking.  She states that they work out together.  She states that she goes to the [facility] frequently where she has a group of friends that she works out with.  Patient states that her life has improved since her son passed away.  She states that prior to him passing away she was constantly worried about him and she had difficulty living her life and traveling because she was always having to check on him.  States that her relationship with her daughter, husband, and 2 grandchildren have improved since her son passed away because she has more time to spend with them.   Discussed methods to reduce the risk of self-injury or suicide attempts: Frequent conversations regarding unsafe thoughts. Remove all significant sharps. Remove all firearms. Remove all medications, including over-the-counter meds. Consider lockbox for medications and having a responsible person dispense medications until patient has strengthened coping skills. Room checks for sharps or other harmful objects. Secure all chemical substances that can be ingested or inhaled.   Please refrain from using alcohol or illicit substances, as they can affect your mood and can cause depression, anxiety or other concerning symptoms.  Alcohol can increase the chance that a person will make reckless decisions, like attempting suicide, and can increase the lethality of a drug overdose.   At time of discharge, patient denies SI, HI, AVH and is able to contract for safety. He demonstrated no overt evidence of psychosis or mania. Prior to discharge the patient verbalized that she understood warning signs, triggers, and symptoms of worsening mental health and how to access emergency mental health care if they  felt it was needed. Patient was instructed to call 911 or return to the emergency room if they experienced any concerning symptoms after discharge. Patient voiced understanding and agreed to this.  Problem 1: Alcohol intoxication   Disposition: No evidence of imminent risk to self or others at present.   Patient does not meet criteria for psychiatric inpatient admission. Supportive therapy provided about ongoing stressors. Discussed crisis plan, support from social network, calling 911, coming to the Emergency Department, and calling Suicide Hotline.     Discharge Instructions       Discharge recommendations:  Patient is to take medications as prescribed. Please see information for follow-up appointment with psychiatry and therapy. Please follow up with your primary care provider for all medical related needs.   Therapy: We recommend that patient participate in individual therapy to address mental health concerns.  Medications: The patient is to contact a medical professional and/or outpatient provider to address any new side effects that develop. Patient should update outpatient providers of any new medications and/or medication changes.   Safety:  The patient should abstain from use  of illicit substances/drugs and abuse of any medications. If symptoms worsen or do not continue to improve or if the patient becomes actively suicidal or homicidal then it is recommended that the patient return to the closest hospital emergency department, the [facility], or call 911 for further evaluation and treatment. National Suicide Prevention Lifeline 1-800-SUICIDE or (952) 376-5146.  About 988 988 offers 24/7 access to trained crisis counselors who can help people experiencing mental health-related distress. People can call or text 988 or chat 988lifeline.org for themselves or if they are worried about a loved one who may need crisis support.  Crisis Mobile:  [facility]:                     8655965114 (for crisis response 24 hours a day) [facility] Hotline:                                            704-569-1737   ________________________________________________________________________________________________  Substance Abuse Treatment Programs   Intensive Outpatient Programs [facility]                                   [address], [place]                                                     [facility] [address] [place], [place]   [place]                             (Inpatient and outpatient)                                             [address]  [place] [phone] (Suboxone and Methadone)   [place], [place] [address]                                       [phone]                                                     [address] [place], [place]   [facility] ([place], [place])                    (insurance only) [phone]                                                                                                                                    [facility] ([place]) [place], [place]                            [facility]                                        [address]                                         [place], [place]                                                      [facility] (for caregivers and family) [phone] [address] Kristeen Mans. [place], [place] [facility]  and [place] ([facility])- new patient walk-in appointments available Monday - Friday 8am -3pm.          [address] [place], [place] [address] [phone] or crisis line- [facility]/ Intensive Outpatient Therapy Program [place], [place] [address] [place]                                                             [phone] [address], [place] [address]                                                 [place]   [facility] [phone]. [place], [place] [address]     [employer] [facility]                                                                                                             [address] Johnette Abraham  [place], [place] [address]                                                           [phone]   [place], [place],  [address] [phone]   [facility]                                   [place] [place] [address]  [place], [place] [address]                                               [place], [place] [address]                                                [phone]                                         [facility] [place] [address]   [facility]                                               [phone] [address] [place], [place], MD [address] [place],  [address] [place]                                               [address] [phone]  [place], [place] [address]                                               [phone]                                                     [facility] [place], [place] [address] [place], NP 12/18/2021, 1:04 PM

## 2021-12-18 NOTE — ED Notes (Signed)
Pt's husband given update of pt's status and POC at this time, advised on visitation and phone policy as well. Ronalee Belts verbalized understanding and thankful for update.

## 2021-12-18 NOTE — ED Notes (Signed)
TTS consult with Rico Sheehan, [facility] beginning at this time

## 2021-12-18 NOTE — ED Notes (Signed)
Pt in good spirits. States "I don't want to kill myself. I got too much to live for, I got my daughter and my granddaughter and my husband. If I just hadn't seen the man who looked like my son and done something stupid like drink two huge glasses of wine on an empty stomach I'd have been fine. I'm never drinking again." Provided pt with phone to check on husband who had cataract surgery this morning.

## 2021-12-18 NOTE — ED Notes (Signed)
Pt appears to be sleeping, observed even RR and unlabored, blanket on pt for warmth and comfort, lights off to room to help induce sleep, NAD noted, sitter within view of pt for safety, room secured, plan of care on going, no further concerns as of present

## 2021-12-18 NOTE — ED Notes (Signed)
Pt awoke, pt wanted to know how she got here and what happen, this RN attempted to explain what brought pt to the ER tonight, pt concern for her husband being mad, attempted to reassure pt that he was concern for her well being and advised to tell her he will check on her in the am and that he loved her. Pt stated "I know what trigger this, I seen my son today". Advised pt on psych consult and in the meantime best to rest. Pt verbalized understanding, additional warm blankets given for comfort. No further concerns at current. Pt within of staff for safety measures.

## 2021-12-18 NOTE — Discharge Instructions (Addendum)
Discharge recommendations:  Patient is to take medications as prescribed. Please see information for follow-up appointment with psychiatry and therapy. Please follow up with your primary care provider for all medical related needs.   Therapy: We recommend that patient participate in individual therapy to address mental health concerns.  Medications: The patient is to contact a medical professional and/or outpatient provider to address any new side effects that develop. Patient should update outpatient providers of any new medications and/or medication changes.   Safety:  The patient should abstain from use of illicit substances/drugs and abuse of any medications. If symptoms worsen or do not continue to improve or if the patient becomes actively suicidal or homicidal then it is recommended that the patient return to the closest hospital emergency department, the [facility], or call 911 for further evaluation and treatment. National Suicide Prevention Lifeline 1-800-SUICIDE or 574-613-7871.  About 988 988 offers 24/7 access to trained crisis counselors who can help people experiencing mental health-related distress. People can call or text 988 or chat 988lifeline.org for themselves or if they are worried about a loved one who may need crisis support.  Crisis Mobile: [facility]:                     773-562-6356 (for crisis response 24 hours a day) [facility] Hotline:                                            (207)382-3263   ________________________________________________________________________________________________  Substance Abuse Treatment Programs   Intensive Outpatient Programs [facility]                                   [address]                                                        [place], [place]                                                      [facility] [place] [address] [place], [place]                             (Inpatient and outpatient)  [place] [phone] (Suboxone and Methadone)   [place], [place] [address]                                       [place] [address] [place], [place]   [facility] (Outpatient/Inpatient, [place])                    (insurance only) [phone]                                                                                                                                    [facility] ([place]) [place], [place]                            [facility]                                        [place], [place]  [phone]                                                     [facility] (for caregivers and family) [address] [place]een Mans. [place], [place] [facility] and [facility] new patient walk-in appointments available Monday - Friday 8am -3pm.          [address] [place], [place] [address] [phone] or crisis line- [facility]/ Intensive Outpatient Therapy Program [place], [place] [address] [place]                                                             [phone] [place], [place] [address]                                                 [place]   [facility] [phone]. [place], [place] [address]     [facility]                                                                                                             [address] Johnette Abraham  [place], [place] [address]                                                           [phone]  [place], [place] [address] [phone]   [facility] [place]                            [place], [place] [address]                                               [place], [place] [address]                                                [phone]                                         [facility] [place] [address]   [facility]                                               [address], [place]                                                     [place], [place], [place] [address] [phone]   [facility]  [address]                                                [place], [place] [address]                                                [phone]                                                     [facility] [address] [place],  [address] [phone]
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
## 2021-12-20 LAB — URINE CULTURE: Culture: >=100000 — AB

## 2021-12-21 ENCOUNTER — Telehealth (HOSPITAL_BASED_OUTPATIENT_CLINIC_OR_DEPARTMENT_OTHER): Payer: Self-pay | Admitting: *Deleted

## 2021-12-21 NOTE — Telephone Encounter (Signed)
Post ED Visit - Positive Culture Follow-up  Culture report reviewed by antimicrobial stewardship pharmacist: [place] Team '[]'$  Nathan Batchelder, Pharm.D. '[]'$  [address], Pharm.D., BCPS AQ-ID '[]'$  Heide Guile, Pharm.D., BCPS '[]'$  Parks Neptune, Pharm.D., BCPS '[]'$  [place], Pharm.D., BCPS, AAHIVP '[]'$  [place], Pharm.D., BCPS, AAHIVP '[]'$  Legrand Como, PharmD, BCPS '[]'$  Salome Arnt, PharmD, BCPS '[]'$  Johnnette Gourd, PharmD, BCPS '[]'$  Hughes Better, PharmD '[]'$  Leeroy Cha, PharmD, BCPS '[]'$  Laqueta Linden, PharmD  [place] Team '[x]'$  [address], PharmD '[]'$  Toma Aran, PharmD '[]'$  Lindell Spar, PharmD '[]'$  Royetta Asal, Rph '[]'$  Graylin Shiver) Rema Fendt, PharmD '[]'$  Glennon Mac, PharmD '[]'$  Arlyn Dunning, PharmD '[]'$  Netta Cedars, PharmD '[]'$  Dia Sitter, PharmD '[]'$  Leone Haven, PharmD '[]'$  Gretta Arab, PharmD '[]'$  Theodis Shove, PharmD '[]'$  Peggyann Juba, PharmD   Positive urine culture No treatment indicated per Reuel Boom, PA-C Astrid Drafts 12/21/2021, 10:57 AM

## 2022-01-09 ENCOUNTER — Observation Stay (HOSPITAL_COMMUNITY)
Admission: EM | Admit: 2022-01-09 | Discharge: 2022-01-10 | Disposition: A | Discharge status: Home / Self Care | Payer: Medicare Other | Attending: Cardiology | Admitting: Cardiology

## 2022-01-09 ENCOUNTER — Other Ambulatory Visit: Payer: Self-pay

## 2022-01-09 ENCOUNTER — Encounter (HOSPITAL_COMMUNITY): Payer: Self-pay | Admitting: Emergency Medicine

## 2022-01-09 ENCOUNTER — Emergency Department (HOSPITAL_COMMUNITY): Payer: Medicare Other

## 2022-01-09 DIAGNOSIS — I2510 Atherosclerotic heart disease of native coronary artery without angina pectoris: Secondary | ICD-10-CM | POA: Diagnosis not present

## 2022-01-09 DIAGNOSIS — R0789 Other chest pain: Secondary | ICD-10-CM | POA: Diagnosis not present

## 2022-01-09 DIAGNOSIS — R079 Chest pain, unspecified: Secondary | ICD-10-CM | POA: Diagnosis not present

## 2022-01-09 DIAGNOSIS — Z7689 Persons encountering health services in other specified circumstances: Secondary | ICD-10-CM | POA: Diagnosis not present

## 2022-01-09 DIAGNOSIS — R42 Dizziness and giddiness: Secondary | ICD-10-CM | POA: Diagnosis not present

## 2022-01-09 DIAGNOSIS — I129 Hypertensive chronic kidney disease with stage 1 through stage 4 chronic kidney disease, or unspecified chronic kidney disease: Secondary | ICD-10-CM | POA: Insufficient documentation

## 2022-01-09 DIAGNOSIS — R9431 Abnormal electrocardiogram [ECG] [EKG]: Secondary | ICD-10-CM | POA: Insufficient documentation

## 2022-01-09 DIAGNOSIS — R072 Precordial pain: Secondary | ICD-10-CM | POA: Diagnosis present

## 2022-01-09 DIAGNOSIS — I471 Supraventricular tachycardia, unspecified: Secondary | ICD-10-CM | POA: Insufficient documentation

## 2022-01-09 DIAGNOSIS — Z7982 Long term (current) use of aspirin: Secondary | ICD-10-CM | POA: Diagnosis not present

## 2022-01-09 DIAGNOSIS — I088 Other rheumatic multiple valve diseases: Secondary | ICD-10-CM | POA: Insufficient documentation

## 2022-01-09 DIAGNOSIS — I700 Atherosclerosis of aorta: Secondary | ICD-10-CM | POA: Insufficient documentation

## 2022-01-09 DIAGNOSIS — Z79899 Other long term (current) drug therapy: Secondary | ICD-10-CM | POA: Insufficient documentation

## 2022-01-09 DIAGNOSIS — E7800 Pure hypercholesterolemia, unspecified: Secondary | ICD-10-CM | POA: Diagnosis not present

## 2022-01-09 DIAGNOSIS — R7989 Other specified abnormal findings of blood chemistry: Secondary | ICD-10-CM | POA: Diagnosis not present

## 2022-01-09 DIAGNOSIS — I214 Non-ST elevation (NSTEMI) myocardial infarction: Secondary | ICD-10-CM | POA: Diagnosis not present

## 2022-01-09 DIAGNOSIS — R778 Other specified abnormalities of plasma proteins: Secondary | ICD-10-CM | POA: Insufficient documentation

## 2022-01-09 DIAGNOSIS — I21A1 Myocardial infarction type 2: Principal | ICD-10-CM

## 2022-01-09 DIAGNOSIS — N183 Chronic kidney disease, stage 3 unspecified: Secondary | ICD-10-CM | POA: Diagnosis not present

## 2022-01-09 HISTORY — DX: Non-ST elevation (NSTEMI) myocardial infarction: I21.4

## 2022-01-09 LAB — CBC WITH DIFFERENTIAL/PLATELET
Abs Immature Granulocytes: 0.01 10*3/uL (ref 0.00–0.07)
Basophils Absolute: 0.1 10*3/uL (ref 0.0–0.1)
Basophils Relative: 1 %
Eosinophils Absolute: 0.2 10*3/uL (ref 0.0–0.5)
Eosinophils Relative: 3 %
HCT: 40.7 % (ref 36.0–46.0)
Hemoglobin: 13.4 g/dL (ref 12.0–15.0)
Immature Granulocytes: 0 %
Lymphocytes Relative: 39 %
Lymphs Abs: 2.1 10*3/uL (ref 0.7–4.0)
MCH: 33.4 pg (ref 26.0–34.0)
MCHC: 32.9 g/dL (ref 30.0–36.0)
MCV: 101.5 fL — ABNORMAL HIGH (ref 80.0–100.0)
Monocytes Absolute: 0.4 10*3/uL (ref 0.1–1.0)
Monocytes Relative: 7 %
Neutro Abs: 2.7 10*3/uL (ref 1.7–7.7)
Neutrophils Relative %: 50 %
Platelets: 267 10*3/uL (ref 150–400)
RBC: 4.01 MIL/uL (ref 3.87–5.11)
RDW: 11.6 % (ref 11.5–15.5)
WBC: 5.5 10*3/uL (ref 4.0–10.5)
nRBC: 0 % (ref 0.0–0.2)

## 2022-01-09 LAB — BASIC METABOLIC PANEL
Anion gap: 8 (ref 5–15)
BUN: 21 mg/dL (ref 8–23)
CO2: 23 mmol/L (ref 22–32)
Calcium: 9 mg/dL (ref 8.9–10.3)
Chloride: 108 mmol/L (ref 98–111)
Creatinine, Ser: 1.24 mg/dL — ABNORMAL HIGH (ref 0.44–1.00)
GFR, Estimated: 46 mL/min — ABNORMAL LOW (ref >60)
Glucose, Bld: 138 mg/dL — ABNORMAL HIGH (ref 70–99)
Potassium: 4.2 mmol/L (ref 3.5–5.1)
Sodium: 139 mmol/L (ref 135–145)

## 2022-01-09 LAB — CBC
HCT: 37.6 % (ref 36.0–46.0)
Hemoglobin: 12.5 g/dL (ref 12.0–15.0)
MCH: 33.2 pg (ref 26.0–34.0)
MCHC: 33.2 g/dL (ref 30.0–36.0)
MCV: 99.7 fL (ref 80.0–100.0)
Platelets: 229 10*3/uL (ref 150–400)
RBC: 3.77 MIL/uL — ABNORMAL LOW (ref 3.87–5.11)
RDW: 11.8 % (ref 11.5–15.5)
WBC: 4.8 10*3/uL (ref 4.0–10.5)
nRBC: 0 % (ref 0.0–0.2)

## 2022-01-09 LAB — CREATININE, SERUM
Creatinine, Ser: 1.01 mg/dL — ABNORMAL HIGH (ref 0.44–1.00)
GFR, Estimated: 59 mL/min — ABNORMAL LOW (ref >60)

## 2022-01-09 LAB — PROTIME-INR
INR: 1.1 (ref 0.8–1.2)
Prothrombin Time: 13.6 seconds (ref 11.4–15.2)

## 2022-01-09 LAB — TROPONIN I (HIGH SENSITIVITY)
Troponin I (High Sensitivity): 11 ng/L (ref <18)
Troponin I (High Sensitivity): 107 ng/L (ref <18)

## 2022-01-09 LAB — MAGNESIUM: Magnesium: 2.1 mg/dL (ref 1.7–2.4)

## 2022-01-09 LAB — TSH: TSH: 2.892 u[IU]/mL (ref 0.350–4.500)

## 2022-01-09 MED ORDER — ENOXAPARIN SODIUM 40 MG/0.4ML IJ SOSY
40.0000 mg | PREFILLED_SYRINGE | INTRAMUSCULAR | Status: DC
  Administered 2022-01-09: 40 mg via SUBCUTANEOUS
  Filled 2022-01-09: qty 0.4

## 2022-01-09 MED ORDER — SODIUM CHLORIDE 0.9 % IV SOLN: INTRAVENOUS | Status: AC

## 2022-01-09 MED ORDER — OXYBUTYNIN CHLORIDE ER 10 MG PO TB24
30.0000 mg | ORAL_TABLET | Freq: Every day | ORAL | Status: DC
  Administered 2022-01-09: 30 mg via ORAL
  Filled 2022-01-09 (×2): qty 3

## 2022-01-09 MED ORDER — AMLODIPINE BESYLATE 5 MG PO TABS
5.0000 mg | ORAL_TABLET | Freq: Every day | ORAL | Status: DC
  Administered 2022-01-10: 5 mg via ORAL
  Filled 2022-01-09: qty 1

## 2022-01-09 MED ORDER — BUPROPION HCL ER (XL) 150 MG PO TB24
150.0000 mg | ORAL_TABLET | Freq: Two times a day (BID) | ORAL | Status: DC
  Administered 2022-01-09 – 2022-01-10 (×2): 150 mg via ORAL
  Filled 2022-01-09 (×2): qty 1

## 2022-01-09 MED ORDER — CLONAZEPAM 0.5 MG PO TABS
0.5000 mg | ORAL_TABLET | Freq: Every day | ORAL | Status: DC
  Administered 2022-01-09: 0.5 mg via ORAL
  Filled 2022-01-09: qty 1

## 2022-01-09 MED ORDER — ASPIRIN 81 MG PO TBEC
81.0000 mg | DELAYED_RELEASE_TABLET | Freq: Every day | ORAL | Status: DC
  Administered 2022-01-10: 81 mg via ORAL
  Filled 2022-01-09: qty 1

## 2022-01-09 MED ORDER — ROSUVASTATIN CALCIUM 20 MG PO TABS
20.0000 mg | ORAL_TABLET | Freq: Every day | ORAL | Status: DC
  Administered 2022-01-09: 20 mg via ORAL
  Filled 2022-01-09: qty 1

## 2022-01-09 MED ORDER — HEPARIN BOLUS VIA INFUSION
3000.0000 [IU] | Freq: Once | INTRAVENOUS | Status: AC
  Administered 2022-01-09: 3000 [IU] via INTRAVENOUS
  Filled 2022-01-09: qty 3000

## 2022-01-09 MED ORDER — ADENOSINE 6 MG/2ML IV SOLN
INTRAVENOUS | Status: AC
  Filled 2022-01-09: qty 4

## 2022-01-09 MED ORDER — MELATONIN 5 MG PO TABS
20.0000 mg | ORAL_TABLET | Freq: Every day | ORAL | Status: DC
  Administered 2022-01-09: 20 mg via ORAL
  Filled 2022-01-09: qty 4

## 2022-01-09 MED ORDER — HEPARIN (PORCINE) 25000 UT/250ML-% IV SOLN
850.0000 [IU]/h | INTRAVENOUS | Status: DC
  Administered 2022-01-09: 850 [IU]/h via INTRAVENOUS
  Filled 2022-01-09: qty 250

## 2022-01-09 MED ORDER — METOPROLOL SUCCINATE ER 25 MG PO TB24
25.0000 mg | ORAL_TABLET | Freq: Every day | ORAL | Status: DC
  Administered 2022-01-09 – 2022-01-10 (×2): 25 mg via ORAL
  Filled 2022-01-09 (×2): qty 1

## 2022-01-09 NOTE — ED Triage Notes (Signed)
Patient c/o chest pain ,dizziness, and headache that started while hiking this morning.

## 2022-01-09 NOTE — ED Provider Notes (Signed)
[facility] DEPT Provider Note   CSN: [id number] Arrival date & time: 01/09/22  1033     History  Chief Complaint  Patient presents with   Chest Pain    Linda Harvey is a 71 y.o. female.  She is complaining of acute pain in her left arm and some tightness in her chest while she was walking today.  She said she normally has a heart rate in the 60s.  She was found in triage with a heart rate of 160 and moved into the resuscitation room.  She said she has never had this before.  No recent illness.  She did take some Excedrin migraine this morning.  She also feels dizzy lightheaded.  The history is provided by the patient.  Chest Pain Pain location:  Substernal area Pain quality: tightness   Pain radiates to:  L arm Pain severity:  Moderate Onset quality:  Sudden Duration:  90 minutes Timing:  Constant Progression:  Unchanged Chronicity:  New Context comment:  Exercise Relieved by:  Nothing Worsened by:  Nothing Ineffective treatments:  None tried Associated symptoms: dizziness and fatigue   Associated symptoms: no abdominal pain, no back pain, no cough, no diaphoresis, no fever, no nausea, no shortness of breath and no vomiting   Risk factors: high cholesterol        Home Medications Prior to Admission medications   Medication Sig Start Date End Date Taking? Authorizing Provider  amLODipine (NORVASC) 5 MG tablet Take 5 mg by mouth daily. 11/05/21   [provider]  aspirin EC 81 MG tablet Take 81 mg by mouth daily.    [provider]  aspirin-acetaminophen-caffeine (EXCEDRIN MIGRAINE) [phone] MG tablet Take 2 tablets by mouth every 6 (six) hours as needed for headache.    [provider]  buPROPion (WELLBUTRIN XL) 150 MG 24 hr tablet Take 150 mg by mouth every morning. 12/11/21   [provider]  clonazePAM (KLONOPIN) 0.5 MG tablet Take 0.5 mg by mouth at bedtime. 09/29/21   [provider]   cyclobenzaprine (FLEXERIL) 5 MG tablet Take 1 tablet (5 mg total) by mouth 3 (three) times daily. Patient not taking: Reported on 10/29/2015 12/31/14   Charlann Lange, PA-C  diphenhydramine-acetaminophen (TYLENOL PM) 25-500 MG TABS tablet Take 1 tablet by mouth at bedtime.    [provider]  lisinopril (ZESTRIL) 20 MG tablet Take 20 mg by mouth daily. 12/09/21   [provider]  MELATONIN PO Take 1 tablet by mouth at bedtime.    [provider]  Multiple Vitamin (MULTIVITAMIN WITH MINERALS) TABS tablet Take 1 tablet by mouth daily.    [provider]  NITROSTAT 0.4 MG SL tablet Place 0.4 mg under the tongue every 5 (five) minutes as needed for chest pain. 06/11/14   [provider]  oxybutynin (DITROPAN XL) 15 MG 24 hr tablet Take 30 mg by mouth daily. 10/10/21   [provider]  pantoprazole (PROTONIX) 20 MG tablet Take 1 tablet (20 mg total) by mouth daily. Patient not taking: Reported on 12/18/2021 06/21/20   Dorie Rank, MD  rosuvastatin (CRESTOR) 20 MG tablet Take 20 mg by mouth at bedtime. 09/28/21   [provider]      Allergies    Patient has no known allergies.    Review of Systems   Review of Systems  Constitutional:  Positive for fatigue. Negative for diaphoresis and fever.  HENT:  Negative for sore throat.   Respiratory:  Negative  for cough and shortness of breath.   Cardiovascular:  Positive for chest pain.  Gastrointestinal:  Negative for abdominal pain, nausea and vomiting.  Genitourinary:  Negative for dysuria.  Musculoskeletal:  Negative for back pain.  Skin:  Negative for rash.  Neurological:  Positive for dizziness.    Physical Exam Updated Vital Signs BP 94/73   Pulse (!) 162   Temp (!) 97.5 F (36.4 C) (Oral)   Resp (!) 23   SpO2 97%  Physical Exam Vitals and nursing note reviewed.  Constitutional:      General: She is not in acute distress.    Appearance: She is well-developed.  HENT:     Head:  Normocephalic and atraumatic.  Eyes:     Conjunctiva/sclera: Conjunctivae normal.  Cardiovascular:     Rate and Rhythm: Regular rhythm. Tachycardia present.     Heart sounds: Normal heart sounds. No murmur heard. Pulmonary:     Effort: Pulmonary effort is normal. No respiratory distress.     Breath sounds: Normal breath sounds.  Abdominal:     Palpations: Abdomen is soft.     Tenderness: There is no abdominal tenderness.  Musculoskeletal:        General: No swelling. Normal range of motion.     Cervical back: Neck supple.     Right lower leg: No tenderness. No edema.     Left lower leg: No tenderness. No edema.  Skin:    General: Skin is warm and dry.     Capillary Refill: Capillary refill takes less than 2 seconds.  Neurological:     General: No focal deficit present.     Mental Status: She is alert.     ED Results / Procedures / Treatments   Labs (all labs ordered are listed, but only abnormal results are displayed) Labs Reviewed  BASIC METABOLIC PANEL - Abnormal; Notable for the following components:      Result Value   Glucose, Bld 138 (*)    Creatinine, Ser 1.24 (*)    GFR, Estimated 46 (*)    All other components within normal limits  CBC WITH DIFFERENTIAL/PLATELET - Abnormal; Notable for the following components:   MCV 101.5 (*)    All other components within normal limits  TROPONIN I (HIGH SENSITIVITY) - Abnormal; Notable for the following components:   Troponin I (High Sensitivity) 107 (*)    All other components within normal limits  MAGNESIUM  TSH  PROTIME-INR  HEPARIN LEVEL (UNFRACTIONATED)  TROPONIN I (HIGH SENSITIVITY)    EKG EKG Interpretation  Date/Time:  Saturday January 09 2022 10:48:32 EDT Ventricular Rate:  167 PR Interval:    QRS Duration: 74 QT Interval:  277 QTC Calculation: 462 R Axis:   -30 Text Interpretation: Atrial fibrillation with rapid V-rate Left axis deviation Abnormal R-wave progression, early transition Repolarization  abnormality, prob rate related afib vs svt new from prior 9/23 Confirmed by Aletta Edouard [phone]) on 01/09/2022 10:58:57 AM  Radiology DG Chest Port 1 View  Result Date: 01/09/2022 CLINICAL DATA:  Chest pain, dizziness EXAM: PORTABLE CHEST 1 VIEW COMPARISON:  12/17/2021 FINDINGS: Transverse diameter of heart is increased. There are no signs of pulmonary edema or focal pulmonary consolidation. Monitoring devices are partly obscuring the lung fields. There is no pleural effusion or pneumothorax. IMPRESSION: There are no signs of pulmonary edema or focal pulmonary consolidation. Electronically Signed   By: Elmer Picker M.D.   On: 01/09/2022 12:38    Procedures .Critical Care  Performed by: Melina Copa,  Rebeca Alert, MD Authorized by: Hayden Rasmussen, MD   Critical care provider statement:    Critical care time (minutes):  45   Critical care time was exclusive of:  Separately billable procedures and treating other patients   Critical care was necessary to treat or prevent imminent or life-threatening deterioration of the following conditions:  Cardiac failure   Critical care was time spent personally by me on the following activities:  Development of treatment plan with patient or surrogate, discussions with consultants, evaluation of patient's response to treatment, examination of patient, obtaining history from patient or surrogate, ordering and performing treatments and interventions, ordering and review of laboratory studies, ordering and review of radiographic studies, pulse oximetry, re-evaluation of patient's condition and review of old charts   I assumed direction of critical care for this patient from another provider in my specialty: no       Medications Ordered in ED Medications  heparin ADULT infusion 100 units/mL (25000 units/217m) (850 Units/hr Intravenous New Bag/Given 01/09/22 1455)  heparin bolus via infusion 3,000 Units (3,000 Units Intravenous Bolus from Bag 01/09/22 1453)     ED Course/ Medical Decision Making/ A&P Clinical Course as of 01/09/22 1639  Sat Jan 09, 2022  1108 She had a possible SVT versus rapid A-fib.  She was placed on a monitor and defib pads.  As a second IV was being established she converted back to sinus rhythm.  She said her chest tightness and arm pain are improved.  Repeat EKG ordered. [MB]  13382Patient's symptoms are all improving since returned back to normal sinus rhythm. [MB]  15053X-ray does not show any acute findings.  Awaiting radiology reading. [MB]  1415 3/16  -she had a myocardial perfusion study that was considered low risk.  Dr. HEllyn Hack[MB]  1416 Second troponin elevated at 107 after initial of 11.  She remains pain-free heart rate in the 50s. [MB]  19767Discussed with cardiology Dr. CSallyanne Kuster  He is [facility].  She will likely need some sort of further investigation. [MB]    Clinical Course User Index [MB] BHayden Rasmussen MD                           Medical Decision Making Amount and/or Complexity of Data Reviewed Labs: ordered. Radiology: ordered.  Risk Decision regarding hospitalization.   This patient complains of chest tightness radiation to left arm; this involves an extensive number of treatment Options and is a complaint that carries with it a high risk of complications and morbidity. The differential includes ACS, pneumonia, PE, arrhythmia, pneumothorax  I ordered, reviewed and interpreted labs, which included CBC normal, chemistries normal other than elevated glucose and creatinine, troponins unremarkable but now rising, TSH normal I ordered medication IV fluids IV heparin and reviewed PMP when indicated. I ordered imaging studies which included chest x-ray and I independently    visualized and interpreted imaging which showed no acute findings Additional history obtained from patient's husband Previous records obtained and reviewed in  epic no recent cardiology visits I consulted: Cardiology Dr. CSallyanne Kusterand discussed lab and imaging findings and discussed disposition.  Cardiac monitoring reviewed, initially SVT now sinus bradycardia Social determinants considered, no significant barriers Critical Interventions: Initiation of heparin for possible NSTEMI  After the interventions stated above, I reevaluated the patient and found patient to be symptomatically improved Admission and further testing considered, she would  benefit from mission for further cardiac evaluation.  She is in agreement with plan.         Final Clinical Impression(s) / ED Diagnoses Final diagnoses:  SVT (supraventricular tachycardia)  Nonspecific chest pain  NSTEMI (non-ST elevated myocardial infarction) [facility])    Rx / DC Orders ED Discharge Orders     None         Hayden Rasmussen, MD 01/09/22 1642

## 2022-01-09 NOTE — Progress Notes (Signed)
ANTICOAGULATION CONSULT NOTE - Initial Consult  Pharmacy Consult for heparin  Indication: chest pain/ACS  No Known Allergies  Patient Measurements: Height:  (on 12/17/2021) Weight: 72.6 kg (160 lb) (on 12/17/21) Heparin Dosing Weight: 72.6 kg  Vital Signs: Temp: 97.4 F (36.3 C) (10/07 1247) Temp Source: Oral (10/07 1247) BP: 160/72 (10/07 1415) Pulse Rate: 53 (10/07 1415)  Labs: Recent Labs    01/09/22 1100 01/09/22 1313  HGB 13.4  --   HCT 40.7  --   PLT 267  --   LABPROT 13.6  --   INR 1.1  --   CREATININE 1.24*  --   TROPONINIHS 11 107*    Estimated Creatinine Clearance: 39.2 mL/min (A) (by C-G formula based on SCr of 1.24 mg/dL (H)).   Medical History: Past Medical History:  Diagnosis Date   Essential hypertension    Heart murmur    per patient report   Hyperlipidemia    Hypertension    Insomnia    NSTEMI (non-ST elevated myocardial infarction) ([place]) 01/09/2022   Obesity (BMI 35.0-39.9 without comorbidity)    Overactive bladder    Severe Acute Febrile Illness as Child     Assessment: 71 yo F with chest pain. Pharmacy consulted to dose heparin for ACS.  No anticoagulants PTA.  INR 1.1, CBC WNL, Trop 107. SCr 1.24  Goal of Therapy:  Heparin level 0.3-0.7 units/ml Monitor platelets by anticoagulation protocol: Yes   Plan:  Give 3000 units bolus x 1 Start heparin infusion at 850 units/hr Check anti-Xa level in 8 hours and daily while on heparin Continue to monitor H&H and platelets   Eudelia Bunch, Pharm.D 01/09/2022 2:46 PM

## 2022-01-09 NOTE — H&P (Signed)
Cardiology Admission History and Physical   Linda Harvey MRN: [id number]; DOB: July 05, 1950   Admission date: 01/09/2022  PCP:  Carol Ada, [facility] Cardiologist:  Glenetta Hew, MD      Chief Complaint:  Chest pain   Linda Profile:   Linda Harvey is a 71 y.o. female with a past medical history of HTN, CKD stage III, HLD, adjustment disorder with depressed mood, depression who is being seen 01/09/2022 for the evaluation of chest pain.  History of Present Illness:   Linda Harvey is a 71 year old female with above medical history who was briefly followed by Dr. Ellyn Hack in 2016. Linda was referred to cardiology in 2016 for evaluation of exertional chest discomfort and chest pain. Echocardiogram on 06/25/2014 showed EF 55-60%, no regional wall motion abnormalities, grade I diastolic dysfunction, mild-moderate tricuspid valve regurgitation. Treadmill stress test on 06/25/2014 showed good exercise capacity, normal perfusion. Overall was a low risk stress test. Linda was lost to follow up.   Linda was seen in the ED on 06/21/2020 for chest pain. Serial troponins were normal, suspected that her symptoms were due to acid reflux.   Linda was most recently seen in the ED on 9/14 after her husband found her in the bathtub, asleep. Apparently, Linda had been depressed about her son's death (4 years ago) and she drank 2 glasses of wine and fell asleep in the bathtub. She was intoxicated in the ED. Linda was evaluated by psychiatry, and it was determined that she was not suicidal. She was discharged from the ED.  Linda [facility] to [facility] ED on 10/7 complaining of chest tightness, left arm pain that occurred while she was hiking this morning. In triage, Linda was found to have a HR in the 160s. EKG showed SVT with a HR of 167 BPM. ER provider planned to give adenosine, but it broke prior to administration. Repeat EKG showed normal sinus rhythm,  PACs, with HR 72 BPM.   Labs in the ED showed Na 139, K 4.2, creatinine 1.24,WBC 5.5, hemoglobin 13.4, platelets 267. hsTn 11>>107. CXR showed no signs of pulmonary edema or focal pulmonary consolidation.  TSH 2.892.   Currently, she is [facility].  She is comfortable in bed.  No further chest discomfort, no further arm, left arm discomfort.  Spoke to her daughter outside room.   Past Medical History:  Diagnosis Date   Essential hypertension    Heart murmur    per Linda report   Hyperlipidemia    Hypertension    Insomnia    NSTEMI (non-ST elevated myocardial infarction) ([place]) 01/09/2022   Obesity (BMI 35.0-39.9 without comorbidity)    Overactive bladder    Severe Acute Febrile Illness as Child     Past Surgical History:  Procedure Laterality Date   NM MYOVIEW LTD  06/25/14   Low Risk ST - normal perfusion, abnormal GXT, EF ~70%   TRANSTHORACIC ECHOCARDIOGRAM  06/25/14    LV size & function - EF 55-60%, Gr 1 DD, mild-mod TR, Tr MR; Mild Aortic Sclerosis   TUBAL LIGATION Bilateral      Medications Prior to Admission: Prior to Admission medications   Medication Sig Start Date End Date Taking? Authorizing Provider  amLODipine (NORVASC) 5 MG tablet Take 5 mg by mouth daily. 11/05/21   [provider]  aspirin EC 81 MG tablet Take 81 mg by mouth daily.    [provider]  aspirin-acetaminophen-caffeine (EXCEDRIN MIGRAINE)  250-250-65 MG tablet Take 2 tablets by mouth every 6 (six) hours as needed for headache.    [provider]  buPROPion (WELLBUTRIN XL) 150 MG 24 hr tablet Take 150 mg by mouth every morning. 12/11/21   [provider]  clonazePAM (KLONOPIN) 0.5 MG tablet Take 0.5 mg by mouth at bedtime. 09/29/21   [provider]  cyclobenzaprine (FLEXERIL) 5 MG tablet Take 1 tablet (5 mg total) by mouth 3 (three) times daily. Linda not taking: Reported on 10/29/2015 12/31/14   Charlann Lange, PA-C   diphenhydramine-acetaminophen (TYLENOL PM) 25-500 MG TABS tablet Take 1 tablet by mouth at bedtime.    [provider]  lisinopril (ZESTRIL) 20 MG tablet Take 20 mg by mouth daily. 12/09/21   [provider]  MELATONIN PO Take 1 tablet by mouth at bedtime.    [provider]  Multiple Vitamin (MULTIVITAMIN WITH MINERALS) TABS tablet Take 1 tablet by mouth daily.    [provider]  NITROSTAT 0.4 MG SL tablet Place 0.4 mg under the tongue every 5 (five) minutes as needed for chest pain. 06/11/14   [provider]  oxybutynin (DITROPAN XL) 15 MG 24 hr tablet Take 30 mg by mouth daily. 10/10/21   [provider]  pantoprazole (PROTONIX) 20 MG tablet Take 1 tablet (20 mg total) by mouth daily. Linda not taking: Reported on 12/18/2021 06/21/20   Dorie Rank, MD  rosuvastatin (CRESTOR) 20 MG tablet Take 20 mg by mouth at bedtime. 09/28/21   [provider]     Allergies:   No Known Allergies  Social History:   Social History   Socioeconomic History   Marital status: Married    Spouse name: Not on file   Number of children: Not on file   Years of education: Not on file   Highest education level: Not on file  Occupational History   Not on file  Tobacco Use   Smoking status: Never   Smokeless tobacco: Never  Vaping Use   Vaping Use: Never used  Substance and Sexual Activity   Alcohol use: Yes    Alcohol/week: 7.0 standard drinks of alcohol    Types: 7 Glasses of wine per week    Comment: 1-2 glasses wine nightly per pt report   Drug use: No   Sexual activity: Yes    Birth control/protection: None  Other Topics Concern   Not on file  Social History Narrative   Lives with he husband - likes to go walking with him   Notes that has not had any "sex drive" x ~2 yrs.   Never smoked.  No EtOH.    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   Social Determinants of Health   Financial Resource Strain: Not on file  Food Insecurity: Not on  file  Transportation Needs: Not on file  Physical Activity: Not on file  Stress: Not on file  Social Connections: Not on file  Intimate Partner Violence: Not on file    Family History:   The Linda's family history includes Cancer in her father; Diabetes in her mother, sister, and son; Epilepsy in her son.    ROS:  Please see the history of present illness.  All other ROS reviewed and negative.     Physical Exam/Data:   Vitals:   01/09/22 1247 01/09/22 1315 01/09/22 1345 01/09/22 1415  BP:  (!) 149/76 138/71 (!) 160/72  Pulse:  60 (!) 58 (!) 53  Resp:  18 16 18  Temp: (!) 97.4 F (36.3 C)     TempSrc: Oral     SpO2:  97% 97% 99%  Weight:    72.6 kg   No intake or output data in the 24 hours ending 01/09/22 1449    01/09/2022    2:15 PM 12/17/2021   10:33 PM 06/21/2020    5:45 PM  Last 3 Weights  Weight (lbs) 160 lb 160 lb 167 lb  Weight (kg) 72.576 kg 72.576 kg 75.751 kg     Body mass index is 28.34 kg/m.  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 Lipids No results for input(s): "CHOL", "TRIG", "HDL", "LABVLDL", "LDLCALC", "CHOLHDL" in the last 168 hours. Hematology Recent Labs  Lab 01/09/22 1100  WBC 5.5  RBC 4.01  HGB 13.4  HCT 40.7  MCV 101.5*  MCH 33.4  MCHC 32.9  RDW 11.6  PLT 267   Thyroid  Recent Labs  Lab 01/09/22 1101  TSH 2.892   BNPNo results for input(s): "BNP", "PROBNP" in the last 168 hours.  DDimer No results for input(s): "DDIMER" in the last 168 hours.   Radiology/Studies:  DG Chest Port 1 View  Result Date: 01/09/2022 CLINICAL DATA:  Chest pain, dizziness EXAM: PORTABLE CHEST 1 VIEW COMPARISON:  12/17/2021 FINDINGS: Transverse diameter of heart is increased. There are no signs of pulmonary edema or focal pulmonary consolidation. Monitoring devices are partly obscuring the lung fields. There is no pleural effusion or pneumothorax. IMPRESSION: There are no signs of pulmonary edema or focal pulmonary consolidation. Electronically Signed   By: Elmer Picker M.D.   On: 01/09/2022 12:38     Assessment and Plan:   Paroxysmal SVT - Linda presented to the ED with a HR in the 160s. EKG showed SVT. ER provider was planning on giving adenosine, but SVT broke prior to administration.  - Follow up EKG showed normal sinus rhythm with HR 72 BPM, PACs present  -Demonstrated to her potential Valsalva type maneuvers to break SVT if she has this again. -Also discussed with her the possibility of electrophysiology consultation and ablation if SVT once again occurs.  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 -I will go ahead and place her on Toprol-XL 25 mg once a day.  Hold parameters for 55 bpm. -She did take 2 Excedrin migraines  earlier this morning.  Sometimes she takes p.m. Advil or melatonin to help sleep.  It is possible with the Excedrin's the excessive caffeine  may have triggered SVT.   Elevated Troponin  - hstn 11>>107  - Suspect trop elevation is secondary to demand ischemia in the setting of rapid heart rate/SVT-technically type II myocardial infarction.  She had ischemic symptoms in the setting of SVT and mildly elevated troponin.  We will go ahead and stop IV heparin. - Ordered echocardiogram to evaluate structure and function of her heart.  I do not appreciate any murmurs on exam. - Ordered coronary CT to evaluate her coronary anatomy.  I want to make sure that she does not have any evidence of flow-limiting coronary artery disease which would have resulted in her left arm pain and chest tightness.  If CAD is present, further goal-directed medical therapy.  HLD  - Continue home crestor 20 mg daily-excellent high intensity statin currently.  HTN -Slightly elevated currently.  We will continue to monitor.  Resume home amlodipine for blood pressure. -We will go ahead and hold the lisinopril 20 mg since creatinine is slightly elevated 1.24.  We will hydrate overnight with anticipation of IV contrast for CT tomorrow.   Risk Assessment/Risk Scores:         Severity of Illness: The appropriate Linda status for this Linda is OBSERVATION. Observation status is judged to be reasonable and necessary in order to provide the required intensity of service to ensure the Linda's safety. The Linda's presenting symptoms, physical exam findings, and initial radiographic and laboratory data in the context of their medical condition is felt to place them at decreased risk for further clinical deterioration. Furthermore, it is anticipated that the Linda will be medically stable for discharge from the hospital within 2 midnights of admission.    For questions or updates, please contact [place] Please consult [email or web address] for contact info under     Signed, Candee Furbish, MD With assistance from Margie Billet, PA-C  01/09/2022  2:49 PM

## 2022-01-09 NOTE — ED Notes (Signed)
[facility] at bedside 

## 2022-01-10 ENCOUNTER — Observation Stay (HOSPITAL_BASED_OUTPATIENT_CLINIC_OR_DEPARTMENT_OTHER): Payer: Medicare Other

## 2022-01-10 ENCOUNTER — Observation Stay (HOSPITAL_COMMUNITY): Payer: Medicare Other

## 2022-01-10 ENCOUNTER — Encounter (HOSPITAL_COMMUNITY): Payer: Self-pay | Admitting: Cardiology

## 2022-01-10 ENCOUNTER — Observation Stay (HOSPITAL_COMMUNITY)
Admit: 2022-01-10 | Discharge: 2022-01-10 | Disposition: A | Discharge status: Home / Self Care | Payer: Medicare Other | Attending: Cardiology | Admitting: Cardiology

## 2022-01-10 ENCOUNTER — Other Ambulatory Visit: Payer: Self-pay | Admitting: Cardiology

## 2022-01-10 ENCOUNTER — Observation Stay (HOSPITAL_BASED_OUTPATIENT_CLINIC_OR_DEPARTMENT_OTHER)
Admit: 2022-01-10 | Discharge: 2022-01-10 | Disposition: A | Discharge status: Home / Self Care | Payer: Medicare Other | Attending: Cardiology | Admitting: Cardiology

## 2022-01-10 DIAGNOSIS — R931 Abnormal findings on diagnostic imaging of heart and coronary circulation: Secondary | ICD-10-CM

## 2022-01-10 DIAGNOSIS — I21A1 Myocardial infarction type 2: Secondary | ICD-10-CM | POA: Diagnosis not present

## 2022-01-10 DIAGNOSIS — I2510 Atherosclerotic heart disease of native coronary artery without angina pectoris: Secondary | ICD-10-CM

## 2022-01-10 DIAGNOSIS — R778 Other specified abnormalities of plasma proteins: Secondary | ICD-10-CM | POA: Diagnosis not present

## 2022-01-10 DIAGNOSIS — I471 Supraventricular tachycardia, unspecified: Secondary | ICD-10-CM | POA: Diagnosis not present

## 2022-01-10 DIAGNOSIS — R079 Chest pain, unspecified: Secondary | ICD-10-CM

## 2022-01-10 HISTORY — DX: Supraventricular tachycardia, unspecified: I47.10

## 2022-01-10 LAB — CBC
HCT: 36 % (ref 36.0–46.0)
Hemoglobin: 12.2 g/dL (ref 12.0–15.0)
MCH: 33.3 pg (ref 26.0–34.0)
MCHC: 33.9 g/dL (ref 30.0–36.0)
MCV: 98.4 fL (ref 80.0–100.0)
Platelets: 207 10*3/uL (ref 150–400)
RBC: 3.66 MIL/uL — ABNORMAL LOW (ref 3.87–5.11)
RDW: 11.6 % (ref 11.5–15.5)
WBC: 3.9 10*3/uL — ABNORMAL LOW (ref 4.0–10.5)
nRBC: 0 % (ref 0.0–0.2)

## 2022-01-10 LAB — BASIC METABOLIC PANEL
Anion gap: 7 (ref 5–15)
BUN: 14 mg/dL (ref 8–23)
CO2: 24 mmol/L (ref 22–32)
Calcium: 9 mg/dL (ref 8.9–10.3)
Chloride: 108 mmol/L (ref 98–111)
Creatinine, Ser: 1.04 mg/dL — ABNORMAL HIGH (ref 0.44–1.00)
GFR, Estimated: 57 mL/min — ABNORMAL LOW (ref >60)
Glucose, Bld: 88 mg/dL (ref 70–99)
Potassium: 3.8 mmol/L (ref 3.5–5.1)
Sodium: 139 mmol/L (ref 135–145)

## 2022-01-10 LAB — ECHOCARDIOGRAM COMPLETE
Area-P 1/2: 4.24 cm2
Height: 63 in
S' Lateral: 3.4 cm
Weight: 2755.2 oz

## 2022-01-10 MED ORDER — NITROGLYCERIN 0.4 MG SL SUBL
0.4000 mg | SUBLINGUAL_TABLET | Freq: Once | SUBLINGUAL | Status: AC
  Administered 2022-01-10: 0.4 mg via SUBLINGUAL

## 2022-01-10 MED ORDER — METOPROLOL SUCCINATE ER 25 MG PO TB24: 25.0000 mg | ORAL_TABLET | Freq: Every day | ORAL | 0 refills | Status: DC

## 2022-01-10 MED ORDER — NITROGLYCERIN 0.4 MG SL SUBL
0.4000 mg | SUBLINGUAL_TABLET | Freq: Once | SUBLINGUAL | Status: AC
  Administered 2022-01-10: 0.4 mg via SUBLINGUAL
  Filled 2022-01-10: qty 1

## 2022-01-10 MED ORDER — IOHEXOL 350 MG/ML SOLN
95.0000 mL | Freq: Once | INTRAVENOUS | Status: AC | PRN
  Administered 2022-01-10: 95 mL via INTRAVENOUS

## 2022-01-10 NOTE — Progress Notes (Signed)
Rounding Note    Patient Name: Linda Harvey Date of Encounter: 01/10/2022  [place] Cardiologist: Glenetta Hew, MD   Subjective   No further chest pain.  Inpatient Medications    Scheduled Meds:  amLODipine  5 mg Oral Daily   aspirin EC  81 mg Oral Daily   buPROPion  150 mg Oral BID   clonazePAM  0.5 mg Oral QHS   enoxaparin (LOVENOX) injection  40 mg Subcutaneous Q24H   melatonin  20 mg Oral QHS   metoprolol succinate  25 mg Oral Daily   oxybutynin  30 mg Oral QHS   rosuvastatin  20 mg Oral QHS   Continuous Infusions:  PRN Meds:    Vital Signs    Vitals:   01/09/22 1415 01/09/22 1650 01/09/22 2011 01/10/22 0411  BP: (!) 160/72 (!) 171/71 133/71 139/62  Pulse: (!) 53 79 (!) 55 (!) 52  Resp: '18 18 16 18  '$ Temp:  98.5 F (36.9 C) 99 F (37.2 C) 98.8 F (37.1 C)  TempSrc:  Oral Oral Oral  SpO2: 99% 98% 96% 94%  Weight: 72.6 kg 78.1 kg    Height:  '5\' 3"'$  (1.6 m)      Intake/Output Summary (Last 24 hours) at 01/10/2022 0853 Last data filed at 01/10/2022 0500 Gross per 24 hour  Intake 832.16 ml  Output 900 ml  Net -67.84 ml      01/09/2022    4:50 PM 01/09/2022    2:15 PM 12/17/2021   10:33 PM  Last 3 Weights  Weight (lbs) 172 lb 3.2 oz 160 lb 160 lb  Weight (kg) 78.109 kg 72.576 kg 72.576 kg      Telemetry    No adverse arrhythmias, sinus bradycardia- Personally Reviewed  ECG    No new- Personally Reviewed  Physical Exam   GEN: No acute distress.   Neck: No JVD Cardiac: Bradycardic regular, no murmurs, rubs, or gallops.  Respiratory: Clear to auscultation bilaterally. GI: Soft, nontender, non-distended  MS: No edema; No deformity. Neuro:  Nonfocal  Psych: Normal affect   Labs    High Sensitivity Troponin:   Recent Labs  Lab 01/09/22 1100 01/09/22 1313  TROPONINIHS 11 107*     Chemistry Recent Labs  Lab 01/09/22 1100 01/09/22 1815 01/10/22 0207  NA 139  --  139  K 4.2  --  3.8  CL 108  --  108  CO2 23  --  24   GLUCOSE 138*  --  88  BUN 21  --  14  CREATININE 1.24* 1.01* 1.04*  CALCIUM 9.0  --  9.0  MG 2.1  --   --   GFRNONAA 46* 59* 57*  ANIONGAP 8  --  7    Lipids No results for input(s): "CHOL", "TRIG", "HDL", "LABVLDL", "LDLCALC", "CHOLHDL" in the last 168 hours.  Hematology Recent Labs  Lab 01/09/22 1100 01/09/22 1815 01/10/22 0207  WBC 5.5 4.8 3.9*  RBC 4.01 3.77* 3.66*  HGB 13.4 12.5 12.2  HCT 40.7 37.6 36.0  MCV 101.5* 99.7 98.4  MCH 33.4 33.2 33.3  MCHC 32.9 33.2 33.9  RDW 11.6 11.8 11.6  PLT 267 229 207   Thyroid  Recent Labs  Lab 01/09/22 1101  TSH 2.892    BNPNo results for input(s): "BNP", "PROBNP" in the last 168 hours.  DDimer No results for input(s): "DDIMER" in the last 168 hours.   Radiology    DG Chest Port 1 View  Result Date: 01/09/2022  CLINICAL DATA:  Chest pain, dizziness EXAM: PORTABLE CHEST 1 VIEW COMPARISON:  12/17/2021 FINDINGS: Transverse diameter of heart is increased. There are no signs of pulmonary edema or focal pulmonary consolidation. Monitoring devices are partly obscuring the lung fields. There is no pleural effusion or pneumothorax. IMPRESSION: There are no signs of pulmonary edema or focal pulmonary consolidation. Electronically Signed   By: Elmer Picker M.D.   On: 01/09/2022 12:38    Cardiac Studies   Echo pending cardiac CT pending  Patient Profile     71 y.o. female with PSVT left arm pain chest tightness elevated troponin of 100, ST segment depression on ECG during SVT which looks like AVNRT.  Assessment & Plan    PSVT Chest pain Elevated troponin -Type II myocardial infarction in the setting of elevated heart rate. -Await coronary CT scan.  Prior coronary calcification seen on prior chest CT. -Continue with low-dose Toprol for suppression of AVNRT. -If AVNRT returns, EP ablation evaluation. -TSH normal. -Hydrated overnight.  Creatinine improved. -Spoke with family.  Heart rate at times in the 40s.  This is benign.   She is asymptomatic with this this is while she is laying in bed.  When she gets up for the bathroom her heart rate does increase appropriately.   For questions or updates, please contact [place] Please consult [email or web address] for contact info under        Signed, Candee Furbish, MD  01/10/2022, 8:53 AM

## 2022-01-10 NOTE — Progress Notes (Signed)
Dcd piv , site unremarkable. Dcd tele box , ccmd notified. Pt denies any pain or sob. Family at the bedside. Tele box returned to nursing station.

## 2022-01-10 NOTE — Discharge Summary (Cosign Needed)
Discharge Summary    Patient ID: Linda Harvey MRN: [id number]; DOB: 09/14/1950  Admit date: 01/09/2022 Discharge date: 01/10/2022  PCP:  Carol Ada, [place] Providers Cardiologist:  Glenetta Hew, MD     Discharge Diagnoses    Principal Problem:   PSVT (paroxysmal supraventricular tachycardia) Active Problems:   Type 2 MI (myocardial infarction) [facility])   Chest pain  Diagnostic Studies/Procedures    Echo: 01/10/2022  IMPRESSIONS     1. Left ventricular ejection fraction, by estimation, is 60 to 65%. The  left ventricle has normal function. The left ventricle has no regional  wall motion abnormalities. Left ventricular diastolic parameters are  consistent with Grade I diastolic  dysfunction (impaired relaxation).   2. Right ventricular systolic function is normal. The right ventricular  size is normal. There is normal pulmonary artery systolic pressure. The  estimated right ventricular systolic pressure is 54.0 mmHg.   3. The mitral valve is normal in structure. Mild mitral valve  regurgitation. No evidence of mitral stenosis. Moderate mitral annular  calcification.   4. The aortic valve is normal in structure. Aortic valve regurgitation is  not visualized. No aortic stenosis is present.   5. The inferior vena cava is normal in size with greater than 50%  respiratory variability, suggesting right atrial pressure of 3 mmHg.   FINDINGS   Left Ventricle: Left ventricular ejection fraction, by estimation, is 60  to 65%. The left ventricle has normal function. The left ventricle has no  regional wall motion abnormalities. The left ventricular internal cavity  size was normal in size. There is   no left ventricular hypertrophy. Left ventricular diastolic parameters  are consistent with Grade I diastolic dysfunction (impaired relaxation).   Right Ventricle: The right ventricular size is normal. No increase in  right ventricular wall thickness. Right  ventricular systolic function is  normal. There is normal pulmonary artery systolic pressure. The tricuspid  regurgitant velocity is 2.50 m/s, and   with an assumed right atrial pressure of 3 mmHg, the estimated right  ventricular systolic pressure is 08.6 mmHg.   Left Atrium: Left atrial size was normal in size.   Right Atrium: Right atrial size was normal in size.   Pericardium: There is no evidence of pericardial effusion.   Mitral Valve: The mitral valve is normal in structure. Moderate mitral  annular calcification. Mild mitral valve regurgitation. No evidence of  mitral valve stenosis.   Tricuspid Valve: The tricuspid valve is normal in structure. Tricuspid  valve regurgitation is mild . No evidence of tricuspid stenosis.   Aortic Valve: The aortic valve is normal in structure. Aortic valve  regurgitation is not visualized. No aortic stenosis is present.   Pulmonic Valve: The pulmonic valve was normal in structure. Pulmonic valve  regurgitation is mild. No evidence of pulmonic stenosis.   Aorta: The aortic root is normal in size and structure.   Venous: The inferior vena cava is normal in size with greater than 50%  respiratory variability, suggesting right atrial pressure of 3 mmHg.   IAS/Shunts: No atrial level shunt detected by color flow Doppler.      _____________   History of Present Illness     Linda Harvey is a 71 y.o. female with a past medical history of HTN, CKD stage III, HLD, adjustment disorder with depressed mood, depression who was seen 01/09/2022 for the evaluation of chest pain.  Linda Harvey is a 71 year old female with above medical history  who was briefly followed by Dr. Ellyn Hack in 2016. Patient was referred to cardiology in 2016 for evaluation of exertional chest discomfort and chest pain. Echocardiogram on 06/25/2014 showed EF 55-60%, no regional wall motion abnormalities, grade I diastolic dysfunction, mild-moderate tricuspid valve regurgitation.  Treadmill stress test on 06/25/2014 showed good exercise capacity, normal perfusion. Overall was a low risk stress test. Patient was lost to follow up.    Patient was seen in the ED on 06/21/2020 for chest pain. Serial troponins were normal, suspected that her symptoms were due to acid reflux.    Patient was recently seen in the ED on 9/14 after her husband found her in the bathtub, asleep. Apparently, patient had been depressed about her son's death (4 years ago) and she drank 2 glasses of wine and fell asleep in the bathtub. She was intoxicated in the ED. Patient was evaluated by psychiatry, and it was determined that she was not suicidal. She was discharged from the ED.   Patient presented to [facility] ED on 10/7 complaining of chest tightness, left arm pain that occurred while she was hiking this morning. In triage, patient was found to have a HR in the 160s. EKG showed SVT with a HR of 167 BPM. ER provider planned to give adenosine, but it broke prior to administration. Repeat EKG showed normal sinus rhythm, PACs, with HR 72 BPM.    Labs in the ED showed Na 139, K 4.2, creatinine 1.24,WBC 5.5, hemoglobin 13.4, platelets 267. hsTn 11>>107. CXR showed no signs of pulmonary edema or focal pulmonary consolidation.  TSH 2.892.    She was transferred to [facility]     Paroxysmal SVT -- Patient presented to the ED with a HR in the 160s. EKG showed SVT. ER provider was planning on giving adenosine, but SVT broke prior to administration.  -- Follow up EKG showed normal sinus rhythm with HR 72 BPM, PACs present  -- placed on Toprol-XL 25 mg daily with no recurrent episodes of AVNRT -- If recurrent AVNRT would prefer to EP for possible ablation   Elevated Troponin  -- hstn 11>>107  -- underwent coronary CTA showing coronary calcium score of 73.7 which is 63rd percentile.  Mild to moderate atherosclerosis of the LAD and RCA.  FFR analysis sent of proximal RCA which showed no  significant obstructive CAD with recommendations for continued risk factor management -- follow up echo showed LVEF of 60 to 50%, grade 1 diastolic dysfunction, normal RV size and function.   HLD  -- Continue home crestor 20 mg daily -- consider outpatient lipids check   HTN -- Blood pressures elevated during admission, resume home lisinopril as well as amlodipine -- Continue low-dose Toprol XL 25 mg daily -- Asked that she keep a BP log and bring to follow-up appointment   Patient was seen by Dr. Marlou Porch and deemed stable for discharge home.  Medication sent to patient's pharmacy of choice.  Follow-up arranged in the office.  Did the patient have an acute coronary syndrome (MI, NSTEMI, STEMI, etc) this admission?:  No.   The elevated Troponin was due to the acute medical illness (demand ischemia).       The patient will be scheduled for a TOC follow up appointment in 10-14 days.  A message has been sent to the [facility] and Scheduling Pool at the office where the patient should be seen for follow up.  _____________  Discharge Vitals Blood pressure (!) 146/67, pulse Marland Kitchen)  55, temperature (!) 97.5 F (36.4 C), temperature source Oral, resp. rate 18, height '5\' 3"'$  (1.6 m), weight 78.1 kg, SpO2 96 %.  Filed Weights   01/09/22 1415 01/09/22 1650  Weight: 72.6 kg 78.1 kg    Labs & Radiologic Studies    CBC Recent Labs    01/09/22 1100 01/09/22 1815 01/10/22 0207  WBC 5.5 4.8 3.9*  NEUTROABS 2.7  --   --   HGB 13.4 12.5 12.2  HCT 40.7 37.6 36.0  MCV 101.5* 99.7 98.4  PLT 267 229 366   Basic Metabolic Panel Recent Labs    01/09/22 1100 01/09/22 1815 01/10/22 0207  NA 139  --  139  K 4.2  --  3.8  CL 108  --  108  CO2 23  --  24  GLUCOSE 138*  --  88  BUN 21  --  14  CREATININE 1.24* 1.01* 1.04*  CALCIUM 9.0  --  9.0  MG 2.1  --   --    Liver Function Tests No results for input(s): "AST", "ALT", "ALKPHOS", "BILITOT", "PROT", "ALBUMIN" in the last 72 hours. No  results for input(s): "LIPASE", "AMYLASE" in the last 72 hours. High Sensitivity Troponin:   Recent Labs  Lab 01/09/22 1100 01/09/22 1313  TROPONINIHS 11 107*    BNP Invalid input(s): "POCBNP" D-Dimer No results for input(s): "DDIMER" in the last 72 hours. Hemoglobin A1C No results for input(s): "HGBA1C" in the last 72 hours. Fasting Lipid Panel No results for input(s): "CHOL", "HDL", "LDLCALC", "TRIG", "CHOLHDL", "LDLDIRECT" in the last 72 hours. Thyroid Function Tests Recent Labs    01/09/22 1101  TSH 2.892   _____________  CT CORONARY FRACTIONAL FLOW RESERVE DATA PREP  Result Date: 01/10/2022 EXAM: FFRCT ANALYSIS FINDINGS: FFRct analysis was performed on the original cardiac CT angiogram dataset. Diagrammatic representation of the FFRct analysis is provided in a separate PDF document in PACS. This dictation was created using the PDF document and an interactive 3D model of the results. 3D model is not available in the EMR/PACS. Normal FFR range is >0.80. 1. Left Main: No significant stenosis.  LM FFR = 1.00. 2. LAD: No significant stenosis. Proximal FFR = 0.99, Mid FFR = 0.94, Distal FFR = 0.89. 3. LCX: No significant stenosis. Proximal FFR = 0.99, Mid FFR = 0.99 distal FFR = 0.96. 4. Ramus: Not modeled. 5. RCA: No significant stenosis. Proximal FFR = 0.99, Mid FFR = 0.94, Distal FFR = 0.91. IMPRESSION: 1.Coronary CTA FFR analysis demonstrates no hemodynamically flow limiting lesions. Fransico Him, MD Electronically Signed   By: Fransico Him M.D.   On: 01/10/2022 14:43   CT CORONARY MORPH W/CTA COR W/SCORE W/CA W/CM &/OR WO/CM  Addendum Date: 01/10/2022   ADDENDUM REPORT: 01/10/2022 14:30 CLINICAL DATA:  Chest pain EXAM: Cardiac/Coronary CTA TECHNIQUE: A non-contrast, gated CT scan was obtained with axial slices of 3 mm through the heart for calcium scoring. Calcium scoring was performed using the Agatston method. A 120 kV prospective, gated, contrast cardiac scan was obtained. Gantry  rotation speed was 250 msecs and collimation was 0.6 mm. Two sublingual nitroglycerin tablets (0.8 mg) were given. The 3D data set was reconstructed in 5% intervals of the 35-75% of the R-R cycle. Diastolic phases were analyzed on a dedicated workstation using MPR, MIP, and VRT modes. The patient received 95 cc of contrast. FINDINGS: Image quality: Good.  Some slab artifact is present. Noise artifact is: Limited. Coronary Arteries:  Normal coronary origin.  Right dominance.  Left main: The left main is a large caliber vessel with a normal take off from the left coronary cusp that trifurcates into a LAD, LCX, and ramus intermedius. There is no plaque or stenosis. Left anterior descending artery: The LAD is patent and gives off 2 patent diagonal branches. There is mild calcified plaque in the proximal LAD with associated stenosis of 25-49%. Ramus intermedius: Small caliber and immediately bifurcates into a superior and inferior branches. The proximal and mid portions of each vessel are patent. Left circumflex artery: The LCX is non-dominant and patent with no evidence of plaque or stenosis. The LCX gives off 2 patent obtuse marginal branches. Right coronary artery: The RCA is dominant with normal take off from the right coronary cusp. The RCA terminates as a PDA and right posterolateral branch. There is mild to moderate mixed plaque in the proximal RCA with associated stenosis of at least 25-49% but could be as high as 60%. Right Atrium: Right atrial size is within normal limits. Right Ventricle: The right ventricular cavity is within normal limits. Left Atrium: Left atrial size is normal in size with no left atrial appendage filling defect. Left Ventricle: The ventricular cavity size is within normal limits. There are no stigmata of prior infarction. There is no abnormal filling defect. Pulmonary arteries: Normal in size without proximal filling defect. Pulmonary veins: Normal pulmonary venous drainage. Pericardium:  Normal thickness with no significant effusion or calcium present. Cardiac valves: The aortic valve is trileaflet without significant calcification. The mitral valve is normal structure with moderate to severe mitral annular calcification. Aorta: Normal caliber with no significant disease. Extra-cardiac findings: See attached radiology report for non-cardiac structures. IMPRESSION: 1. Coronary calcium score of 73.7. This was 63rd percentile for age-, sex, and race-matched controls. 2.  Normal coronary origin with right dominance. 3. Mild to moderate atherosclerosis of the LAD and RCA. CAD RADS 2. 4.  Recommend preventive therapy and risk factor modification. 5. This study has been submitted for FFR analysis for assessment of the proximal RCA. RECOMMENDATIONS: 1. CAD-RADS 0: No evidence of CAD (0%). Consider non-atherosclerotic causes of chest pain. 2. CAD-RADS 1: Minimal non-obstructive CAD (0-24%). Consider non-atherosclerotic causes of chest pain. Consider preventive therapy and risk factor modification. 3. CAD-RADS 2: Mild non-obstructive CAD (25-49%). Consider non-atherosclerotic causes of chest pain. Consider preventive therapy and risk factor modification. 4. CAD-RADS 3: Moderate stenosis. Consider symptom-guided anti-ischemic pharmacotherapy as well as risk factor modification per guideline directed care. Additional analysis with CT FFR will be submitted. 5. CAD-RADS 4: Severe stenosis. (70-99% or > 50% left main). Cardiac catheterization or CT FFR is recommended. Consider symptom-guided anti-ischemic pharmacotherapy as well as risk factor modification per guideline directed care. Invasive coronary angiography recommended with revascularization per published guideline statements. 6. CAD-RADS 5: Total coronary occlusion (100%). Consider cardiac catheterization or viability assessment. Consider symptom-guided anti-ischemic pharmacotherapy as well as risk factor modification per guideline directed care. 7.  CAD-RADS N: Non-diagnostic study. Obstructive CAD can't be excluded. Alternative evaluation is recommended. Fransico Him, MD Electronically Signed   By: Fransico Him M.D.   On: 01/10/2022 14:30   Result Date: 01/10/2022 EXAM: OVER-READ INTERPRETATION  CT CHEST The following report is a limited chest CT over-read performed by radiologist Dr. Vinnie Langton of [facility], [place] on 01/10/2022. This over-read does not include interpretation of cardiac or coronary anatomy or pathology. The coronary calcium score and cardiac CTA interpretation by the cardiologist is attached. COMPARISON:  Chest CTA 06/21/2020. FINDINGS: Tiny 3 mm right upper  lobe pulmonary nodule (axial image 10 of series 11), 3 mm left upper lobe pulmonary nodule (axial image 19 of series 11) and tiny 2 mm right middle lobe pulmonary nodule (axial image 23 of series 11), all unchanged compared to the prior study from 06/21/2020, considered definitively benign requiring no imaging follow-up. Atherosclerotic calcifications are noted in the thoracic aorta. Within the visualized portions of the thorax there are no other larger more suspicious appearing pulmonary nodules or masses, there is no acute consolidative airspace disease, no pleural effusions, no pneumothorax and no lymphadenopathy. Visualized portions of the upper abdomen are unremarkable. There are no aggressive appearing lytic or blastic lesions noted in the visualized portions of the skeleton. IMPRESSION: 1.  Aortic Atherosclerosis (ICD10-I70.0). Electronically Signed: By: Vinnie Langton M.D. On: 01/10/2022 12:16   ECHOCARDIOGRAM COMPLETE  Result Date: 01/10/2022    ECHOCARDIOGRAM REPORT   Patient Name:   Linda Harvey Date of Exam: 01/10/2022 Medical Rec #:  [id number]       Height:       63.0 in Accession #:    [id number]      Weight:       172.2 lb Date of Birth:  1951/02/24        BSA:          1.814 m Patient Age:    71 years        BP:           181/74 mmHg Patient Gender: F                HR:           49 bpm. Exam Location:  Inpatient Procedure: 2D Echo, Cardiac Doppler and Color Doppler Indications:    elevated troponin  History:        Patient has prior history of Echocardiogram examinations, most                 recent 06/25/2014. Signs/Symptoms:Chest Pain and Murmur; Risk                 Factors:Hypertension.  Sonographer:    Johny Chess RDCS Referring Phys: [id number] [place]  1. Left ventricular ejection fraction, by estimation, is 60 to 65%. The left ventricle has normal function. The left ventricle has no regional wall motion abnormalities. Left ventricular diastolic parameters are consistent with Grade I diastolic dysfunction (impaired relaxation).  2. Right ventricular systolic function is normal. The right ventricular size is normal. There is normal pulmonary artery systolic pressure. The estimated right ventricular systolic pressure is 32.3 mmHg.  3. The mitral valve is normal in structure. Mild mitral valve regurgitation. No evidence of mitral stenosis. Moderate mitral annular calcification.  4. The aortic valve is normal in structure. Aortic valve regurgitation is not visualized. No aortic stenosis is present.  5. The inferior vena cava is normal in size with greater than 50% respiratory variability, suggesting right atrial pressure of 3 mmHg. FINDINGS  Left Ventricle: Left ventricular ejection fraction, by estimation, is 60 to 65%. The left ventricle has normal function. The left ventricle has no regional wall motion abnormalities. The left ventricular internal cavity size was normal in size. There is  no left ventricular hypertrophy. Left ventricular diastolic parameters are consistent with Grade I diastolic dysfunction (impaired relaxation). Right Ventricle: The right ventricular size is normal. No increase in right ventricular wall thickness. Right ventricular systolic function is normal. There is normal pulmonary artery systolic pressure. The  tricuspid regurgitant velocity  is 2.50 m/s, and  with an assumed right atrial pressure of 3 mmHg, the estimated right ventricular systolic pressure is 06.2 mmHg. Left Atrium: Left atrial size was normal in size. Right Atrium: Right atrial size was normal in size. Pericardium: There is no evidence of pericardial effusion. Mitral Valve: The mitral valve is normal in structure. Moderate mitral annular calcification. Mild mitral valve regurgitation. No evidence of mitral valve stenosis. Tricuspid Valve: The tricuspid valve is normal in structure. Tricuspid valve regurgitation is mild . No evidence of tricuspid stenosis. Aortic Valve: The aortic valve is normal in structure. Aortic valve regurgitation is not visualized. No aortic stenosis is present. Pulmonic Valve: The pulmonic valve was normal in structure. Pulmonic valve regurgitation is mild. No evidence of pulmonic stenosis. Aorta: The aortic root is normal in size and structure. Venous: The inferior vena cava is normal in size with greater than 50% respiratory variability, suggesting right atrial pressure of 3 mmHg. IAS/Shunts: No atrial level shunt detected by color flow Doppler.  LEFT VENTRICLE PLAX 2D LVIDd:         4.20 cm   Diastology LVIDs:         3.40 cm   LV e' medial:    5.22 cm/s LV PW:         0.90 cm   LV E/e' medial:  11.8 LV IVS:        1.00 cm   LV e' lateral:   7.62 cm/s LVOT diam:     1.90 cm   LV E/e' lateral: 8.1 LV SV:         76 LV SV Index:   42 LVOT Area:     2.84 cm  RIGHT VENTRICLE             IVC RV S prime:     13.10 cm/s  IVC diam: 1.10 cm TAPSE (M-mode): 1.8 cm LEFT ATRIUM             Index        RIGHT ATRIUM           Index LA diam:        3.80 cm 2.09 cm/m   RA Area:     11.60 cm LA Vol (A2C):   57.8 ml 31.85 ml/m  RA Volume:   23.90 ml  13.17 ml/m LA Vol (A4C):   42.1 ml 23.20 ml/m LA Biplane Vol: 50.9 ml 28.05 ml/m  AORTIC VALVE LVOT Vmax:   115.00 cm/s LVOT Vmean:  70.400 cm/s LVOT VTI:    0.269 m  AORTA Ao Root diam: 3.10  cm Ao Asc diam:  3.20 cm MITRAL VALVE                TRICUSPID VALVE MV Area (PHT): 4.24 cm     TR Peak grad:   25.0 mmHg MV Decel Time: 179 msec     TR Vmax:        250.00 cm/s MV E velocity: 61.60 cm/s MV A velocity: 103.00 cm/s  SHUNTS MV E/A ratio:  0.60         Systemic VTI:  0.27 m                             Systemic Diam: 1.90 cm Candee Furbish MD Electronically signed by Candee Furbish MD Signature Date/Time: 01/10/2022/12:42:03 PM    Final    DG Chest Port 1 View  Result Date: 01/09/2022 CLINICAL  DATA:  Chest pain, dizziness EXAM: PORTABLE CHEST 1 VIEW COMPARISON:  12/17/2021 FINDINGS: Transverse diameter of heart is increased. There are no signs of pulmonary edema or focal pulmonary consolidation. Monitoring devices are partly obscuring the lung fields. There is no pleural effusion or pneumothorax. IMPRESSION: There are no signs of pulmonary edema or focal pulmonary consolidation. Electronically Signed   By: Elmer Picker M.D.   On: 01/09/2022 12:38   CT Head Wo Contrast  Result Date: 12/17/2021 CLINICAL DATA:  Mental status change, alcohol/drug use. Husband found patient unresponsive in the bathtub. EXAM: CT HEAD WITHOUT CONTRAST TECHNIQUE: Contiguous axial images were obtained from the base of the skull through the vertex without intravenous contrast. RADIATION DOSE REDUCTION: This exam was performed according to the departmental dose-optimization program which includes automated exposure control, adjustment of the mA and/or kV according to patient size and/or use of iterative reconstruction technique. COMPARISON:  CT examination dated October 29, 2015. FINDINGS: Brain: No evidence of acute infarction, hemorrhage, hydrocephalus, extra-axial collection or mass lesion/mass effect. Vascular: No hyperdense vessel or unexpected calcification. Skull: Normal. Negative for fracture or focal lesion. Sinuses/Orbits: No acute finding. Other: None. IMPRESSION: No acute intracranial abnormality. Electronically  Signed   By: Keane Police D.O.   On: 12/17/2021 23:06   DG Chest 2 View  Result Date: 12/17/2021 CLINICAL DATA:  Altered mental status. EXAM: CHEST - 2 VIEW COMPARISON:  June 21, 2020 FINDINGS: The heart size and mediastinal contours are within normal limits. Decreased lung volumes are noted. Both lungs are clear. The visualized skeletal structures are unremarkable. IMPRESSION: No active cardiopulmonary disease. Electronically Signed   By: Virgina Norfolk M.D.   On: 12/17/2021 22:54   Disposition   Pt is being discharged home today in good condition.  Follow-up Plans & Appointments     Follow-up Information     Carol Ada, MD .   Specialty: Family Medicine Contact information: [place] [address] [phone]         Lenna Sciara, NP Follow up on 01/25/2022.   Specialties: Nurse Practitioner, Family Medicine Why: at 10am for your follow up appt with cardiology Contact information: [address] [place]  [address] [phone]                Discharge Instructions     Call MD for:  difficulty breathing, headache or visual disturbances   Complete by: As directed    Call MD for:  persistant dizziness or light-headedness   Complete by: As directed    Diet - low sodium heart healthy   Complete by: As directed    Discharge instructions   Complete by: As directed    Please keep a log of your blood pressures to bring to your follow up visit.   Increase activity slowly   Complete by: As directed         Discharge Medications   Allergies as of 01/10/2022   No Known Allergies      Medication List     STOP taking these medications    cyclobenzaprine 5 MG tablet Commonly known as: FLEXERIL   pantoprazole 20 MG tablet Commonly known as: PROTONIX       TAKE these medications    amLODipine 5 MG tablet Commonly known as: NORVASC Take 5 mg by mouth daily.   aspirin EC 81 MG tablet Take 81 mg  by mouth daily.   buPROPion 150 MG 24 hr tablet Commonly known as:  WELLBUTRIN XL Take 150 mg by mouth in the morning and at bedtime.   Centrum Silver 50+Women Tabs Take 1 tablet by mouth daily with breakfast.   clonazePAM 0.5 MG tablet Commonly known as: KLONOPIN Take 0.5 mg by mouth at bedtime.   Creatine Powd Take 1 Scoop by mouth See admin instructions. Mix 1 scoopful into a shake and drink once a day as needed for restless legs   diphenhydramine-acetaminophen 25-500 MG Tabs tablet Commonly known as: TYLENOL PM Take 2 tablets by mouth at bedtime.   Excedrin Extra Strength 250-250-65 MG tablet Generic drug: aspirin-acetaminophen-caffeine Take 2 tablets by mouth in the morning.   lisinopril 20 MG tablet Commonly known as: ZESTRIL Take 20 mg by mouth daily.   Melatonin 10 MG Tabs Take 20 mg by mouth at bedtime.   metoprolol succinate 25 MG 24 hr tablet Commonly known as: TOPROL-XL Take 1 tablet (25 mg total) by mouth daily. Start taking on: January 11, 2022   Nitrostat 0.4 MG SL tablet Generic drug: nitroGLYCERIN Place 0.4 mg under the tongue every 5 (five) minutes as needed for chest pain.   oxybutynin 15 MG 24 hr tablet Commonly known as: DITROPAN XL Take 30 mg by mouth at bedtime.   rosuvastatin 20 MG tablet Commonly known as: CRESTOR Take 20 mg by mouth at bedtime.   Tums 500 MG chewable tablet Generic drug: calcium carbonate Chew 1-3 tablets by mouth daily as needed for indigestion or heartburn.   Zaditor 0.025 % ophthalmic solution Generic drug: ketotifen Place 1 drop into both eyes 2 (two) times daily as needed (for itching).           Outstanding Labs/Studies   N/a   Duration of Discharge Encounter   Greater than 30 minutes including physician time.  Signed, Reino Bellis, NP 01/10/2022, 3:42 PM   Personally seen and examined. Agree with above.  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 Discussed with family.   Candee Furbish, MD

## 2022-01-10 NOTE — Progress Notes (Signed)
  Echocardiogram 2D Echocardiogram has been performed.  Johny Chess 01/10/2022, 12:17 PM

## 2022-01-11 NOTE — Progress Notes (Signed)
Made phone call to discuss CT findings with husband Juanda Crumble [phone].at his request.  Martin Majestic straight to voicemail.  Candee Furbish, MD

## 2022-01-25 ENCOUNTER — Ambulatory Visit: Payer: Medicare Other | Attending: Nurse Practitioner | Admitting: Nurse Practitioner

## 2022-01-25 ENCOUNTER — Encounter: Payer: Self-pay | Admitting: Nurse Practitioner

## 2022-01-25 VITALS — BP 124/62 | HR 53 | Ht 64.0 in | Wt 175.0 lb

## 2022-01-25 DIAGNOSIS — I2510 Atherosclerotic heart disease of native coronary artery without angina pectoris: Secondary | ICD-10-CM | POA: Diagnosis not present

## 2022-01-25 DIAGNOSIS — I471 Supraventricular tachycardia, unspecified: Secondary | ICD-10-CM

## 2022-01-25 DIAGNOSIS — E785 Hyperlipidemia, unspecified: Secondary | ICD-10-CM

## 2022-01-25 DIAGNOSIS — R7989 Other specified abnormal findings of blood chemistry: Secondary | ICD-10-CM | POA: Diagnosis not present

## 2022-01-25 DIAGNOSIS — I10 Essential (primary) hypertension: Secondary | ICD-10-CM | POA: Diagnosis not present

## 2022-01-25 NOTE — Patient Instructions (Signed)
Medication Instructions:  Your physician recommends that you continue on your current medications as directed. Please refer to the Current Medication list given to you today.   *If you need a refill on your cardiac medications before your next appointment, please call your pharmacy*   Lab Work: NONE ordered at this time of appointment   If you have labs (blood work) drawn today and your tests are completely normal, you will receive your results only by: Ada (if you have MyChart) OR A paper copy in the mail If you have any lab test that is abnormal or we need to change your treatment, we will call you to review the results.   Testing/Procedures: NONE ordered at this time of appointment     Follow-Up: At [facility], you and your health needs are our priority.  As part of our continuing mission to provide you with exceptional heart care, we have created designated Provider Care Teams.  These Care Teams include your primary Cardiologist (physician) and Advanced Practice Providers (APPs -  Physician Assistants and Nurse Practitioners) who all work together to provide you with the care you need, when you need it.  We recommend signing up for the patient portal called "MyChart".  Sign up information is provided on this After Visit Summary.  MyChart is used to connect with patients for Virtual Visits (Telemedicine).  Patients are able to view lab/test results, encounter notes, upcoming appointments, etc.  Non-urgent messages can be sent to your provider as well.   To learn more about what you can do with MyChart, go to [email or web address].    Your next appointment:   5-6 month(s)  The format for your next appointment:   In Person  Provider:   Glenetta Hew, MD  or Diona Browner, NP        Other Instructions   Important Information About Sugar

## 2022-01-25 NOTE — Progress Notes (Signed)
Office Visit    Patient Name: Linda Harvey Date of Encounter: 01/25/2022  Primary Care Provider:  Carol Ada, MD Primary Cardiologist:  Glenetta Hew, MD  Chief Complaint    71 year old female with a history of CAD, PSVT, hypertension, hyperlipidemia, CKD stage III, and adjustment disorder with depressed mood who presents for hospital follow-up related to chest pain and PSVT.  Past Medical History    Past Medical History:  Diagnosis Date   Essential hypertension    Heart murmur    per patient report   Hyperlipidemia    Hypertension    Insomnia    NSTEMI (non-ST elevated myocardial infarction) ([place]) 01/09/2022   Obesity (BMI 35.0-39.9 without comorbidity)    Overactive bladder    PSVT (paroxysmal supraventricular tachycardia) 01/10/2022   Severe Acute Febrile Illness as Child    Past Surgical History:  Procedure Laterality Date   NM MYOVIEW LTD  06/25/14   Low Risk ST - normal perfusion, abnormal GXT, EF ~70%   TRANSTHORACIC ECHOCARDIOGRAM  06/25/14    LV size & function - EF 55-60%, Gr 1 DD, mild-mod TR, Tr MR; Mild Aortic Sclerosis   TUBAL LIGATION Bilateral     Allergies  No Known Allergies  History of Present Illness    71 year old female with the above past medical history including CAD, PSVT, hypertension, hyperlipidemia, CKD stage III, and adjustment disorder with depressed mood.   She was previously followed by Dr. Ellyn Hack in 2016 for the evaluation of exertional chest discomfort.  Echocardiogram in 06/2014 showed EF 55 to 60%, no RWMA, G1 DD, mild to moderate tricuspid valve regurgitation.  Treadmill stress test in 06/2014 showed good exercise capacity, normal perfusion, low risk.  She has not been seen in follow-up since.  She was seen in the ED in September 2023 after her husband found her in the bathtub asleep. She had been depressed about her son's death 4 years prior and had drank 2 glasses of wine that evening prior to falling asleep in the bathtub.   She was evaluated by psychiatry and it was determined that she was not suicidal.  She returned to the ED on 01/09/2022 with complaints of chest tightness, left arm pain while hiking.  EKG showed SVT, HR 167 bpm.  Rhythm broke prior to administration of adenosine.  Troponin was elevated.  She was transferred to [facility] for further evaluation.  Cardiology was consulted.  She was started on metoprolol.  It was noted that if she should have recurrent AVNRT she would likely benefit from EP evaluation for consideration of possible ablation.  Troponin was thought to be elevated secondary to demand ischemia in the setting of SVT.  Echocardiogram showed EF 60 to 65%, normal LV function, G1 DD, mild mitral valve regurgitation, moderate MAC.  Coronary CTA showed coronary calcium score of 73.7 (63rd percentile), mild to moderate atherosclerosis of the LAD and RCA, negative FFR.  Of note, BP was mildly elevated during admission.  She was discharged home in stable condition on 01/10/2022.  She presents today for follow-up.  Since her hospitalization she has been stable from a cardiac standpoint.  She denies any recurrent palpitations, denies dizziness, presyncope, syncope, denies symptoms concerning for angina.  She is tolerating her metoprolol. She is exercising regularly. Overall, she reports feeling well.  Home Medications    Current Outpatient Medications  Medication Sig Dispense Refill   amLODipine (NORVASC) 5 MG tablet Take 5 mg by mouth daily.     aspirin EC 81 MG  tablet Take 81 mg by mouth daily.     buPROPion (WELLBUTRIN XL) 150 MG 24 hr tablet Take 150 mg by mouth in the morning and at bedtime.     clonazePAM (KLONOPIN) 0.5 MG tablet Take 0.5 mg by mouth at bedtime.     Creatine POWD Take 1 Scoop by mouth See admin instructions. Mix 1 scoopful into a shake and drink once a day as needed for restless legs     diphenhydramine-acetaminophen (TYLENOL PM) 25-500 MG TABS tablet Take 2 tablets by mouth at  bedtime.     EXCEDRIN EXTRA STRENGTH 250-250-65 MG tablet Take 2 tablets by mouth in the morning.     lisinopril (ZESTRIL) 20 MG tablet Take 20 mg by mouth daily.     Melatonin 10 MG TABS Take 20 mg by mouth at bedtime.     metoprolol succinate (TOPROL-XL) 25 MG 24 hr tablet Take 1 tablet (25 mg total) by mouth daily. 90 tablet 0   Multiple Vitamins-Minerals (CENTRUM SILVER 50+WOMEN) TABS Take 1 tablet by mouth daily with breakfast.     NITROSTAT 0.4 MG SL tablet Place 0.4 mg under the tongue every 5 (five) minutes as needed for chest pain.     oxybutynin (DITROPAN XL) 15 MG 24 hr tablet Take 30 mg by mouth at bedtime.     rosuvastatin (CRESTOR) 20 MG tablet Take 20 mg by mouth at bedtime.     TUMS 500 MG chewable tablet Chew 1-3 tablets by mouth daily as needed for indigestion or heartburn.     ZADITOR 0.025 % ophthalmic solution Place 1 drop into both eyes 2 (two) times daily as needed (for itching).     No current facility-administered medications for this visit.     Review of Systems    She denies chest pain, palpitations, dyspnea, pnd, orthopnea, n, v, dizziness, syncope, edema, weight gain, or early satiety. All other systems reviewed and are otherwise negative except as noted above.   Physical Exam    VS:  BP 124/62   Pulse (!) 53   Ht _0  (1.626 m)   Wt 175 lb (79.4 kg)   SpO2 97%   BMI 30.04 kg/m   GEN: Well nourished, well developed, in no acute distress. HEENT: normal. Neck: Supple, no JVD, carotid bruits, or masses. Cardiac: RRR, no murmurs, rubs, or gallops. No clubbing, cyanosis, edema.  Radials/DP/PT 2+ and equal bilaterally.  Respiratory:  Respirations regular and unlabored, clear to auscultation bilaterally. GI: Soft, nontender, nondistended, BS + x 4. MS: no deformity or atrophy. Skin: warm and dry, no rash. Neuro:  Strength and sensation are intact. Psych: Normal affect.  Accessory Clinical Findings    ECG personally reviewed by me today - sinus  bradycardia, 53 bpm - no acute changes.   Lab Results  Component Value Date   WBC 3.9 (L) 01/10/2022   HGB 12.2 01/10/2022   HCT 36.0 01/10/2022   MCV 98.4 01/10/2022   PLT 207 01/10/2022   Lab Results  Component Value Date   CREATININE 1.04 (H) 01/10/2022   BUN 14 01/10/2022   NA 139 01/10/2022   K 3.8 01/10/2022   CL 108 01/10/2022   CO2 24 01/10/2022   Lab Results  Component Value Date   ALT 31 12/17/2021   AST 35 12/17/2021   ALKPHOS 64 12/17/2021   BILITOT 0.4 12/17/2021   Lab Results  Component Value Date   CHOL 177 10/30/2015   HDL 51 10/30/2015   LDLCALC 115 (  H) 10/30/2015   TRIG 53 10/30/2015   CHOLHDL 3.5 10/30/2015     Lab Results  Component Value Date   HGBA1C 5.4 10/30/2015    Assessment & Plan    1. PSVT: Noted during recent hospitalization. Denies any recurrent palpitations.  She is monitoring her HR with a smart watch.  Continue metoprolol.  If recurrent AVNRT, consider EP referral for possible ablation.   2. Elevated troponin/nonobstructive CAD: Troponin elevated likely due to demand ischemia in the setting of SVT. Recent echo showed EF 60 to 65%, normal LV function, G1 DD, mild mitral valve regurgitation, moderate MAC.  Coronary CTA showed coronary calcium score of 73.7 (63rd percentile), mild to moderate atherosclerosis of the LAD and RCA, negative FFR. Stable with no anginal symptoms. Continue aspirin, amlodipine, lisinopril, metoprolol, Crestor.  3. Hypertension: BP well controlled. Continue current antihypertensive regimen.   4. Hyperlipidemia: LDL was 58 in 07/2021.  Continue aspirin, Crestor.  Monitored and managed per PCP.  5. Disposition: Follow-up in 6 months, sooner if needed.      Lenna Sciara, NP 01/25/2022, 10:56 AM

## 2022-01-28 DIAGNOSIS — E7800 Pure hypercholesterolemia, unspecified: Secondary | ICD-10-CM | POA: Diagnosis not present

## 2022-01-28 DIAGNOSIS — N3281 Overactive bladder: Secondary | ICD-10-CM | POA: Diagnosis not present

## 2022-01-28 DIAGNOSIS — G4700 Insomnia, unspecified: Secondary | ICD-10-CM | POA: Diagnosis not present

## 2022-01-28 DIAGNOSIS — I10 Essential (primary) hypertension: Secondary | ICD-10-CM | POA: Diagnosis not present

## 2022-01-28 DIAGNOSIS — Z23 Encounter for immunization: Secondary | ICD-10-CM | POA: Diagnosis not present

## 2022-01-28 DIAGNOSIS — F419 Anxiety disorder, unspecified: Secondary | ICD-10-CM | POA: Diagnosis not present

## 2022-07-23 DIAGNOSIS — D485 Neoplasm of uncertain behavior of skin: Secondary | ICD-10-CM | POA: Diagnosis not present

## 2022-07-23 DIAGNOSIS — L910 Hypertrophic scar: Secondary | ICD-10-CM | POA: Diagnosis not present

## 2022-07-23 DIAGNOSIS — D0461 Carcinoma in situ of skin of right upper limb, including shoulder: Secondary | ICD-10-CM | POA: Diagnosis not present

## 2022-07-23 DIAGNOSIS — C44719 Basal cell carcinoma of skin of left lower limb, including hip: Secondary | ICD-10-CM | POA: Diagnosis not present

## 2022-09-13 ENCOUNTER — Other Ambulatory Visit: Payer: Self-pay

## 2022-09-13 ENCOUNTER — Encounter (HOSPITAL_COMMUNITY): Payer: Self-pay

## 2022-09-13 ENCOUNTER — Emergency Department (HOSPITAL_COMMUNITY)
Admission: EM | Admit: 2022-09-13 | Discharge: 2022-09-13 | Disposition: A | Discharge status: Home / Self Care | Payer: Medicare Other | Attending: Emergency Medicine | Admitting: Emergency Medicine

## 2022-09-13 ENCOUNTER — Emergency Department (HOSPITAL_COMMUNITY): Payer: Medicare Other

## 2022-09-13 DIAGNOSIS — Z7982 Long term (current) use of aspirin: Secondary | ICD-10-CM | POA: Diagnosis not present

## 2022-09-13 DIAGNOSIS — R002 Palpitations: Secondary | ICD-10-CM

## 2022-09-13 DIAGNOSIS — I10 Essential (primary) hypertension: Secondary | ICD-10-CM | POA: Insufficient documentation

## 2022-09-13 DIAGNOSIS — R0602 Shortness of breath: Secondary | ICD-10-CM

## 2022-09-13 DIAGNOSIS — Z79899 Other long term (current) drug therapy: Secondary | ICD-10-CM | POA: Insufficient documentation

## 2022-09-13 DIAGNOSIS — I471 Supraventricular tachycardia, unspecified: Secondary | ICD-10-CM | POA: Diagnosis not present

## 2022-09-13 LAB — TSH: TSH: 1.753 u[IU]/mL (ref 0.350–4.500)

## 2022-09-13 LAB — BASIC METABOLIC PANEL
Anion gap: 11 (ref 5–15)
BUN: 19 mg/dL (ref 8–23)
CO2: 23 mmol/L (ref 22–32)
Calcium: 9.2 mg/dL (ref 8.9–10.3)
Chloride: 106 mmol/L (ref 98–111)
Creatinine, Ser: 1.2 mg/dL — ABNORMAL HIGH (ref 0.44–1.00)
GFR, Estimated: 48 mL/min — ABNORMAL LOW (ref >60)
Glucose, Bld: 118 mg/dL — ABNORMAL HIGH (ref 70–99)
Potassium: 4.2 mmol/L (ref 3.5–5.1)
Sodium: 140 mmol/L (ref 135–145)

## 2022-09-13 LAB — CBC
HCT: 41.4 % (ref 36.0–46.0)
Hemoglobin: 14.2 g/dL (ref 12.0–15.0)
MCH: 33.7 pg (ref 26.0–34.0)
MCHC: 34.3 g/dL (ref 30.0–36.0)
MCV: 98.3 fL (ref 80.0–100.0)
Platelets: 254 10*3/uL (ref 150–400)
RBC: 4.21 MIL/uL (ref 3.87–5.11)
RDW: 11.8 % (ref 11.5–15.5)
WBC: 5.4 10*3/uL (ref 4.0–10.5)
nRBC: 0 % (ref 0.0–0.2)

## 2022-09-13 LAB — MAGNESIUM: Magnesium: 2.1 mg/dL (ref 1.7–2.4)

## 2022-09-13 LAB — TROPONIN I (HIGH SENSITIVITY): Troponin I (High Sensitivity): 6 ng/L (ref <18)

## 2022-09-13 MED ORDER — SODIUM CHLORIDE 0.9 % IV BOLUS
500.0000 mL | Freq: Once | INTRAVENOUS | Status: AC
  Administered 2022-09-13: 500 mL via INTRAVENOUS

## 2022-09-13 MED ORDER — ADENOSINE 6 MG/2ML IV SOLN
6.0000 mg | Freq: Once | INTRAVENOUS | Status: AC
  Administered 2022-09-13: 6 mg via INTRAVENOUS
  Filled 2022-09-13: qty 2

## 2022-09-13 NOTE — ED Provider Notes (Signed)
[place] EMERGENCY DEPARTMENT AT [facility] Provider Note   CSN: [id number] Arrival date & time: 09/13/22  1154     History  Chief Complaint  Patient presents with   Palpitations   Dizziness    Linda Harvey is a 72 y.o. female.  The history is provided by the patient, medical records and the spouse. No language interpreter was used.  Palpitations Palpitations quality:  Fast Onset quality:  Unable to specify Duration:  1 day Timing:  Constant Progression:  Unchanged Chronicity:  Recurrent Relieved by:  Nothing Worsened by:  Nothing Ineffective treatments:  None tried Associated symptoms: chest pain (tightness), malaise/fatigue, near-syncope and shortness of breath   Associated symptoms: no back pain, no chest pressure, no cough, no diaphoresis, no dizziness, no leg pain, no lower extremity edema (unchanged from baseline per pt), no nausea, no numbness, no syncope, no vomiting and no weakness   Dizziness Quality:  Lightheadedness Associated symptoms: chest pain (tightness), palpitations and shortness of breath   Associated symptoms: no diarrhea, no headaches, no nausea, no syncope, no vomiting and no weakness        Home Medications Prior to Admission medications   Medication Sig Start Date End Date Taking? Authorizing Provider  amLODipine (NORVASC) 5 MG tablet Take 5 mg by mouth daily. 11/05/21   [provider]  aspirin EC 81 MG tablet Take 81 mg by mouth daily.    [provider]  buPROPion (WELLBUTRIN XL) 150 MG 24 hr tablet Take 150 mg by mouth in the morning and at bedtime. 12/11/21   [provider]  clonazePAM (KLONOPIN) 0.5 MG tablet Take 0.5 mg by mouth at bedtime. 09/29/21   [provider]  Creatine POWD Take 1 Scoop by mouth See admin instructions. Mix 1 scoopful into a shake and drink once a day as needed for restless legs    [provider]  diphenhydramine-acetaminophen (TYLENOL PM) 25-500 MG TABS  tablet Take 2 tablets by mouth at bedtime.    [provider]  EXCEDRIN EXTRA STRENGTH [id number] MG tablet Take 2 tablets by mouth in the morning.    [provider]  lisinopril (ZESTRIL) 20 MG tablet Take 20 mg by mouth daily. 12/09/21   [provider]  Melatonin 10 MG TABS Take 20 mg by mouth at bedtime.    [provider]  metoprolol succinate (TOPROL-XL) 25 MG 24 hr tablet Take 1 tablet (25 mg total) by mouth daily. 01/11/22   Arty Baumgartner, NP  Multiple Vitamins-Minerals (CENTRUM SILVER 50+WOMEN) TABS Take 1 tablet by mouth daily with breakfast.    [provider]  NITROSTAT 0.4 MG SL tablet Place 0.4 mg under the tongue every 5 (five) minutes as needed for chest pain. 06/11/14   [provider]  oxybutynin (DITROPAN XL) 15 MG 24 hr tablet Take 30 mg by mouth at bedtime. 10/10/21   [provider]  rosuvastatin (CRESTOR) 20 MG tablet Take 20 mg by mouth at bedtime. 09/28/21   [provider]  TUMS 500 MG chewable tablet Chew 1-3 tablets by mouth daily as needed for indigestion or heartburn.    [provider]  ZADITOR 0.025 % ophthalmic solution Place 1 drop into both eyes 2 (two) times daily as needed (for itching).    [provider]      Allergies    Patient has no known allergies.    Review of Systems   Review of Systems  Constitutional:  Positive for malaise/fatigue.  Negative for chills, diaphoresis, fatigue and fever.  HENT:  Negative for congestion.   Eyes:  Negative for visual disturbance.  Respiratory:  Positive for chest tightness and shortness of breath. Negative for cough and wheezing.   Cardiovascular:  Positive for chest pain (tightness), palpitations, leg swelling (mild symmetric) and near-syncope. Negative for syncope.  Gastrointestinal:  Negative for abdominal pain, diarrhea, nausea and vomiting.  Genitourinary:  Negative for dysuria and flank pain.  Musculoskeletal:  Negative for  back pain, neck pain and neck stiffness.  Skin:  Negative for rash and wound.  Neurological:  Positive for light-headedness. Negative for dizziness, syncope, speech difficulty, weakness, numbness and headaches.  Psychiatric/Behavioral:  Negative for agitation and confusion.   All other systems reviewed and are negative.   Physical Exam Updated Vital Signs BP 102/75 (BP Location: Right Arm)   Pulse (!) 155   Temp 98 F (36.7 C)   Resp 19   Ht 5\' 4"  (1.626 m)   Wt 79.4 kg   SpO2 98%   BMI 30.04 kg/m  Physical Exam Vitals and nursing note reviewed.  Constitutional:      General: She is not in acute distress.    Appearance: She is well-developed. She is not ill-appearing, toxic-appearing or diaphoretic.  HENT:     Head: Normocephalic and atraumatic.     Nose: No congestion or rhinorrhea.     Mouth/Throat:     Mouth: Mucous membranes are moist.  Eyes:     Conjunctiva/sclera: Conjunctivae normal.     Pupils: Pupils are equal, round, and reactive to light.  Cardiovascular:     Rate and Rhythm: Regular rhythm. Tachycardia present.     Pulses: Normal pulses.     Heart sounds: Murmur heard.  Pulmonary:     Effort: Pulmonary effort is normal. No respiratory distress.     Breath sounds: Normal breath sounds. No wheezing, rhonchi or rales.  Chest:     Chest wall: No tenderness.  Abdominal:     General: Abdomen is flat. There is no distension.     Palpations: Abdomen is soft.     Tenderness: There is no abdominal tenderness. There is no right CVA tenderness, left CVA tenderness, guarding or rebound.  Musculoskeletal:        General: No swelling or tenderness.     Cervical back: Neck supple. No tenderness.     Right lower leg: Edema present.     Left lower leg: Edema present.  Skin:    General: Skin is warm and dry.     Capillary Refill: Capillary refill takes less than 2 seconds.     Findings: No erythema or rash.  Neurological:     General: No focal deficit present.      Mental Status: She is alert.  Psychiatric:        Mood and Affect: Mood normal.     ED Results / Procedures / Treatments   Labs (all labs ordered are listed, but only abnormal results are displayed) Labs Reviewed  BASIC METABOLIC PANEL - Abnormal; Notable for the following components:      Result Value   Glucose, Bld 118 (*)    Creatinine, Ser 1.20 (*)    GFR, Estimated 48 (*)    All other components within normal limits  CBC  MAGNESIUM  TSH  TROPONIN I (HIGH SENSITIVITY)    EKG EKG Interpretation  Date/Time:  Monday September 13 2022 12:39:18 EDT Ventricular Rate:  73 PR Interval:  158 QRS Duration:  93 QT Interval:  410 QTC Calculation: 452 R Axis:   -22 Text Interpretation: Sinus rhythm Left atrial enlargement Abnormal R-wave progression, early transition Left ventricular hypertrophy when compared top rior, now sinus from SVT. No STEMI Confirmed by Theda Belfast ([id number]) on 09/13/2022 12:41:59 PM  EKG Interpretation  Date/Time:  Monday September 13 2022 11:59:40 EDT Ventricular Rate:  155 PR Interval:    QRS Duration: 94 QT Interval:  301 QTC Calculation: 484 R Axis:   -32 Text Interpretation: Supraventricular tachycardia Left axis deviation Abnormal R-wave progression, early transition Repolarization abnormality, prob rate related when compared to prior, similar appearce to previous SVT episode last year. No STEMI Confirmed by Theda Belfast ([id number]) on 09/13/2022 12:27:28 PM  Radiology DG Chest Port 1 View  Result Date: 09/13/2022 CLINICAL DATA:  10009 Palpitations 10009 EXAM: PORTABLE CHEST 1 VIEW COMPARISON:  CXR 01/09/22 FINDINGS: No pleural effusion. No pneumothorax. No focal airspace opacity. No radiographically apparent displaced rib fractures. Visualized upper abdomen is unremarkable. Cardiac and mediastinal contours. IMPRESSION: No focal airspace opacity. Electronically Signed   By: Lorenza Cambridge M.D.   On: 09/13/2022 13:22    Procedures .Cardioversion  Date/Time:  09/13/2022 12:43 PM  Performed by: Heide Scales, MD Authorized by: Heide Scales, MD   Consent:    Consent obtained:  Verbal and emergent situation   Consent given by:  Patient   Risks discussed:  Induced arrhythmia   Alternatives discussed:  No treatment Pre-procedure details:    Cardioversion basis:  Emergent   Rhythm:  Supraventricular tachycardia Patient sedated: No Post-procedure details:    Patient status:  Awake   Patient tolerance of procedure:  Tolerated well, no immediate complications Comments:     Adenosine use for cardioversion of SVT.  Successful on first attempt at 6 mg.      Medications Ordered in ED Medications  sodium chloride 0.9 % bolus 500 mL (0 mLs Intravenous Stopped 09/13/22 1305)  adenosine (ADENOCARD) 6 MG/2ML injection 6 mg (6 mg Intravenous Given 09/13/22 1232)    ED Course/ Medical Decision Making/ A&P                             Medical Decision Making Amount and/or Complexity of Data Reviewed Labs: ordered. Radiology: ordered.  Risk Prescription drug management.    Linda Harvey is a 72 y.o. female with a past medical history significant for hypertension, hyperlipidemia, aortic disease, depression, previous NSTEMI, and PSVT episodes who presents with palpitations, chest tightness, shortness of breath, and left arm discomfort.  Patient reports that she was feeling well going to bed last night but woke up this morning having chest tightness, shortness of breath, and left arm discomfort.  She reports that this feels similar to when she had her NSTEMI and had to be admitted last year when she was in SVT.  She reports that has been hours since she started feeling it and is still feeling palpitations.  She reports she has episodes like this most weekly but normally she is able to get herself out of it at home.  She reports that he did not seem to improve after several hours so she presents for evaluation.  She received aspirin with  EMS and presents for eval.  She does report she has restless legs and was taking CBD for several months.  She reports no fevers, chills, congestion, or cough.  Denies nausea, vomiting, constipation, diarrhea, or urinary changes.  Denies leg  pain or leg swelling at this time.  Denies any trauma.  On arrival, heart rate found to be in the 150s and 160s with what appears to be SVT on EKG.  EKG did not show STEMI.  On exam, lungs were clear.  Chest nontender.  She does have a murmur.  Abdomen nontender.  Intact pulses in extremities and legs are slightly edematous but she reports they are unchanged from her baseline.  Patient otherwise well-appearing.  Given this is likely the SVT and history of this, a vagal maneuver was attempted without success.  Decision made to use adenosine to cardiovert given her history and then we will get labs and likely talk to cardiology given the frequency of these episodes she reports.  6 mg adenosine was utilized without any complication and we converted back to a sinus rhythm.  She is having resolution of the palpitations and chest tightness and shortness of breath.  Will get screening labs including delta troponin and will talk to cardiology given her frequent nature of this.  It appears in previous documentation that they have considered ablation if it continued.  Anticipate discussion with cardiology after workup is completed.  1:48 PM Spoke to cardiology who reviewed the case and they feel she does not need admitted.  They feel she is safe for discharge home and does not need a second troponin given her resolution of symptoms after the conversion.  Patient will follow-up with them to see the electrophysiology team.  Patient agrees.  Will p.o. challenge and if she passes, will discharge home.  Otherwise her labs are reassuring initially.  TSH still in process and she can follow-up with her cardiology team for that.  Patient discharged in stable condition and remains  in sinus rhythm without symptoms.         Final Clinical Impression(s) / ED Diagnoses Final diagnoses:  SVT (supraventricular tachycardia)  Palpitations  Shortness of breath    Rx / DC Orders ED Discharge Orders     None       Clinical Impression: 1. SVT (supraventricular tachycardia)   2. Palpitations   3. Shortness of breath     Disposition: Discharge  Condition: Good  I have discussed the results, Dx and Tx plan with the pt(& family if present). He/she/they expressed understanding and agree(s) with the plan. Discharge instructions discussed at great length. Strict return precautions discussed and pt &/or family have verbalized understanding of the instructions. No further questions at time of discharge.    New Prescriptions   No medications on file    Follow Up: [facility] [address] [place] [address] [phone]    the electrophysiology cardiology team at [facility], Canary Brim, MD 09/13/22 1351

## 2022-09-13 NOTE — ED Notes (Signed)
Pt converted from SVT to NSR with one dose of Adenosine. MD at bedside. Patient hooked up to Zoll. Pt's HR back to the 70's.

## 2022-09-13 NOTE — Discharge Instructions (Signed)
Your history, exam, and evaluation today are consistent with recurrent SVT leading to the palpitations and chest symptoms.  We were able to convert you using the medicine adenosine without difficulty.  After conversion, we checked labs and your labs were overall reassuring.  The thyroid test was still in process so please follow-up with this with your cardiology team.  The cardiology team was called and feel you are safe for discharge home to follow-up with the outpatient electrophysiology cardiology team with [facility].  Please rest and stay hydrated.  They did want you to double your Toprol-XL from 25 mg daily up to 50 mg daily.  Please call to schedule that appointment for follow-up.  If any symptoms change or worsen acutely, please return to the nearest emergency department.

## 2022-09-13 NOTE — ED Triage Notes (Addendum)
Pt c/o dizziness, heart palpations, and L arm pain starting this morning.  Pain score 4/10. Pt was seen and admitted last fall for SVT and an NSTEMI.  Pt reports she started using CBD x2 months ago for restless leg syndrome.   Pt reports taking 162mg  Aspirin prior to arrival.    Pt is not followed by Cardiology.

## 2022-09-13 NOTE — ED Notes (Signed)
Pt transferred to [facility] due to SVT.

## 2022-09-22 NOTE — Progress Notes (Unsigned)
Cardiology Office Note:  .   Date:  09/23/2022  ID:  [name], DOB 27-Mar-1951, MRN [id number] PCP: Merri Brunette, MD  [facility] Providers Cardiologist:  Bryan Lemma, MD {  History of Present Illness: .   Linda Harvey is a 72 y.o. female with a past medical history of CAD, PSVT, hypertension, hyperlipidemia, CKD stage III, and adjustment disorder with depressed mood who presents for hospital follow-up.  She was last seen in clinic 01/2022.  History includes being followed by Dr. Herbie Baltimore since 2016 for evaluation of exertional chest pain.  Echocardiogram 06/2014 showed LVEF 55 to 60%, no RWMA, G1 DD, mild to moderate tricuspid valve regurgitation.  Treadmill stress test 3/16 showed good exercise capacity, normal perfusion, low risk.  Has not been seen in follow-up since.  Was seen in the ED September 2023 after her husband found her in the bathtub asleep.  And depressed about her son's death 4 years prior and had drink 2 glasses of wine in the evening prior to falling asleep in the bathtub.  Evaluated by psych was determined that she was not suicidal.  Returns to ED 01/09/2022 with complaint of chest tightness, left arm pain while hiking.  EKG showed SVT, heart rate 167 bpm.  Rhythm broke prior to administration of adenosine.  Troponin was elevated.  Transfer to [facility] for further evaluation.  Cardiology consulted.  Started metoprolol.  Was noted that if she should have recurrent AVNRT would likely benefit from EP evaluation.  Troponin was thought to be elevated due to demand ischemia in setting of SVT.  Echo showed EF 66 5%, normal LV function, G1 DD, mild mitral valve regurgitation, moderate MAC.  Coronary CTA showed coronary calcium score 73.7 (63rd percentile), mild to moderate atherosclerosis of LAD and RCA, negative FFR.  Of note BP mildly elevated during admission.  Was seen in follow-up 01/2022 and have been stable from a cardiac standpoint.  Denies recurrent  palpitations, dizziness, syncope, presyncope, chest pain.  Tolerating metoprolol.  Was recently in the ED 09/13/2022 for SVT.  On arrival, heart rates were in the 150s to 160s.  EKG did not show STEMI.  Chest nontender.  A vagal maneuver was attempted without success.  6 mg of adenosine was utilized without any complication and she was converted back to normal sinus rhythm.  Resolution of palpitations and chest tightness as well as shortness of breath.  TSH was pending at the time of discharge.  Today, she tells me that she has terrible restless leg syndrome.  She uses hemp/CBD drops and return for this is the only thing that helps.  She tells me she has no energy at all.  She also is having some shortness of breath and dizziness.  She was started on metoprolol succinate for her palpitations/SVT.  She does not feel good on this medication and wants to try something different.  Discussed the importance of the beta-blocker in the setting of SVT.  Ended up prescribing her carvedilol to try.  This should not have any interactions with her CBD oil but will also forward on the pharmacy.  Reports no shortness of breath nor dyspnea on exertion. Reports no chest pain, pressure, or tightness. No edema, orthopnea, PND.  ROS: Refer to HPI for pertinent ROS.  Studies Reviewed: Marland Kitchen        Coronary CTA 10/23 showed calcium score of 73.7.  63rd percentile for age, sex, and reached max controls.  Mild to moderate atherosclerosis of the LAD and RCA.  Recommended preventative therapy and risk factor modification.  FFR negative.       Physical Exam:   VS:  BP 138/84   Pulse (!) 52   Ht 5\' 4"  (1.626 m)   Wt 178 lb 9.6 oz (81 kg)   SpO2 99%   BMI 30.66 kg/m    Wt Readings from Last 3 Encounters:  09/23/22 178 lb 9.6 oz (81 kg)  09/13/22 175 lb (79.4 kg)  01/25/22 175 lb (79.4 kg)    GEN: Well nourished, well developed in no acute distress NECK: No JVD; No carotid bruits CARDIAC: RRR, no murmurs, rubs,  gallops RESPIRATORY: RLL + rales  ABDOMEN: Soft, non-tender, non-distended EXTREMITIES:  No edema; No deformity   ASSESSMENT AND PLAN: .   1.  PSVT/palpitations -We changed from metoprolol succinate to carvedilol 3.125 twice daily -We discussed that" hemp oil" is not FDA approved and can have additive ingredients that are not disclosed on the label. -ZIO monitor ordered 7 days  2.  Elevated troponin/nonobstructive CAD -Continue current medication regimen -Coronary CTA from October reviewed -No ischemic workup indicated at this time  3.  Hypertension -Blood pressure borderline high today. -Would continue current medications and keep track of -Continue low-sodium, heart healthy diet   4.  Hyperlipidemia -LDL 58 -Repeat lipid panel at next visit  Dispo: Please follow-up with me in 2 months.  Signed, Sharlene Dory, PA-C

## 2022-09-23 ENCOUNTER — Ambulatory Visit: Payer: Medicare Other | Attending: Physician Assistant | Admitting: Physician Assistant

## 2022-09-23 ENCOUNTER — Ambulatory Visit (INDEPENDENT_AMBULATORY_CARE_PROVIDER_SITE_OTHER): Payer: Medicare Other

## 2022-09-23 ENCOUNTER — Encounter: Payer: Self-pay | Admitting: Physician Assistant

## 2022-09-23 VITALS — BP 138/84 | HR 52 | Ht 64.0 in | Wt 178.6 lb

## 2022-09-23 DIAGNOSIS — I471 Supraventricular tachycardia, unspecified: Secondary | ICD-10-CM | POA: Diagnosis not present

## 2022-09-23 DIAGNOSIS — I2510 Atherosclerotic heart disease of native coronary artery without angina pectoris: Secondary | ICD-10-CM | POA: Diagnosis not present

## 2022-09-23 DIAGNOSIS — R002 Palpitations: Secondary | ICD-10-CM

## 2022-09-23 DIAGNOSIS — E785 Hyperlipidemia, unspecified: Secondary | ICD-10-CM | POA: Diagnosis not present

## 2022-09-23 DIAGNOSIS — I10 Essential (primary) hypertension: Secondary | ICD-10-CM | POA: Diagnosis not present

## 2022-09-23 DIAGNOSIS — R7989 Other specified abnormal findings of blood chemistry: Secondary | ICD-10-CM

## 2022-09-23 MED ORDER — CARVEDILOL 3.125 MG PO TABS: 3.1250 mg | ORAL_TABLET | Freq: Two times a day (BID) | ORAL | 3 refills | Status: DC

## 2022-09-23 NOTE — Patient Instructions (Signed)
Medication Instructions:  1.Stop metoprolol 2.Start carvedilol 3.125 mg twice daily *If you need a refill on your cardiac medications before your next appointment, please call your pharmacy*   Lab Work: None ordered If you have labs (blood work) drawn today and your tests are completely normal, you will receive your results only by: MyChart Message (if you have MyChart) OR A paper copy in the mail If you have any lab test that is abnormal or we need to change your treatment, we will call you to review the results.   Testing/Procedures: Christena Deem- Long Term Monitor Instructions  Your physician has requested you wear a ZIO patch monitor for 7 days.  This is a single patch monitor. [employer] supplies one patch monitor per enrollment. Additional stickers are not available. Please do not apply patch if you will be having a Nuclear Stress Test,  Echocardiogram, Cardiac CT, MRI, or Chest Xray during the period you would be wearing the  monitor. The patch cannot be worn during these tests. You cannot remove and re-apply the  ZIO XT patch monitor.  Your ZIO patch monitor will be mailed 3 day USPS to your address on file. It may take 3-5 days  to receive your monitor after you have been enrolled.  Once you have received your monitor, please review the enclosed instructions. Your monitor  has already been registered assigning a specific monitor serial # to you.  Billing and Patient Assistance Program Information  We have supplied [employer] with any of your insurance information on file for billing purposes. [employer] offers a sliding scale Patient Assistance Program for patients that do not have  insurance, or whose insurance does not completely cover the cost of the ZIO monitor.  You must apply for the Patient Assistance Program to qualify for this discounted rate.  To apply, please call [employer] at [phone], select option 4, select option 2, ask to apply for  Patient Assistance Program. Meredeth Ide  will ask your household income, and how many people  are in your household. They will quote your out-of-pocket cost based on that information.  [employer] will also be able to set up a 37-month, interest-free payment plan if needed.  Applying the monitor   Shave hair from upper left chest.  Hold abrader disc by orange tab. Rub abrader in 40 strokes over the upper left chest as  indicated in your monitor instructions.  Clean area with 4 enclosed alcohol pads. Let dry.  Apply patch as indicated in monitor instructions. Patch will be placed under collarbone on left  side of chest with arrow pointing upward.  Rub patch adhesive wings for 2 minutes. Remove white label marked "1". Remove the white  label marked "2". Rub patch adhesive wings for 2 additional minutes.  While looking in a mirror, press and release button in center of patch. A small green light will  flash 3-4 times. This will be your only indicator that the monitor has been turned on.  Do not shower for the first 24 hours. You may shower after the first 24 hours.  Press the button if you feel a symptom. You will hear a small click. Record Date, Time and  Symptom in the Patient Logbook.  When you are ready to remove the patch, follow instructions on the last 2 pages of Patient  Logbook. Stick patch monitor onto the last page of Patient Logbook.  Place Patient Logbook in the blue and white box. Use locking tab on box and tape box closed  securely. The blue and white box has prepaid postage on it. Please place it in the mailbox as  soon as possible. Your physician should have your test results approximately 7 days after the  monitor has been mailed back to [facility].  Call [facility] Customer Care at [phone] if you have questions regarding  your ZIO XT patch monitor. Call them immediately if you see an orange light blinking on your  monitor.  If your monitor falls off in less than 4 days, contact our Monitor department at  [phone].  If your monitor becomes loose or falls off after 4 days call [employer] at [phone] for  suggestions on securing your monitor    Follow-Up: At [facility], you and your health needs are our priority.  As part of our continuing mission to provide you with exceptional heart care, we have created designated Provider Care Teams.  These Care Teams include your primary Cardiologist (physician) and Advanced Practice Providers (APPs -  Physician Assistants and Nurse Practitioners) who all work together to provide you with the care you need, when you need it.  We recommend signing up for the patient portal called "MyChart".  Sign up information is provided on this After Visit Summary.  MyChart is used to connect with patients for Virtual Visits (Telemedicine).  Patients are able to view lab/test results, encounter notes, upcoming appointments, etc.  Non-urgent messages can be sent to your provider as well.   To learn more about what you can do with MyChart, go to [email or web address].    Your next appointment:   2 month(s)  Provider:   Jari Favre, PA-C        Other Instructions Check your blood pressure and heart rate daily, 1 hr after morning medications for 2 weeks, keep a log and send [place] the readings through mychart at the end of the 2 weeks.   Low-Sodium Eating Plan Salt (sodium) helps you keep a healthy balance of fluids in your body. Too much sodium can raise your blood pressure. It can also cause fluid and waste to be held in your body. Your health care provider or dietitian may recommend a low-sodium eating plan if you have high blood pressure (hypertension), kidney disease, liver disease, or heart failure. Eating less sodium can help lower your blood pressure and reduce swelling. It can also protect your heart, liver, and kidneys. What are tips for following this plan? Reading food labels  Check food labels for the amount of sodium per serving. If you eat  more than one serving, you must multiply the listed amount by the number of servings. Choose foods with less than 140 milligrams (mg) of sodium per serving. Avoid foods with 300 mg of sodium or more per serving. Always check how much sodium is in a product, even if the label says "unsalted" or "no salt added." Shopping  Buy products labeled as "low-sodium" or "no salt added." Buy fresh foods. Avoid canned foods and pre-made or frozen meals. Avoid canned, cured, or processed meats. Buy breads that have less than 80 mg of sodium per slice. Cooking  Eat more home-cooked food. Try to eat less restaurant, buffet, and fast food. Try not to add salt when you cook. Use salt-free seasonings or herbs instead of table salt or sea salt. Check with your provider or pharmacist before using salt substitutes. Cook with plant-based oils, such as canola, sunflower, or olive oil. Meal planning When eating at a restaurant, ask if your food can be made  with less salt or no salt. Avoid dishes labeled as brined, pickled, cured, or smoked. Avoid dishes made with soy sauce, miso, or teriyaki sauce. Avoid foods that have monosodium glutamate (MSG) in them. MSG may be added to some restaurant food, sauces, soups, bouillon, and canned foods. Make meals that can be grilled, baked, poached, roasted, or steamed. These are often made with less sodium. General information Try to limit your sodium intake to 1,500-2,300 mg each day, or the amount told by your provider. What foods should I eat? Fruits Fresh, frozen, or canned fruit. Fruit juice. Vegetables Fresh or frozen vegetables. "No salt added" canned vegetables. "No salt added" tomato sauce and paste. Low-sodium or reduced-sodium tomato and vegetable juice. Grains Low-sodium cereals, such as oats, puffed wheat and rice, and shredded wheat. Low-sodium crackers. Unsalted rice. Unsalted pasta. Low-sodium bread. Whole grain breads and whole grain pasta. Meats and other  proteins Fresh or frozen meat, poultry, seafood, and fish. These should have no added salt. Low-sodium canned tuna and salmon. Unsalted nuts. Dried peas, beans, and lentils without added salt. Unsalted canned beans. Eggs. Unsalted nut butters. Dairy Milk. Soy milk. Cheese that is naturally low in sodium, such as ricotta cheese, fresh mozzarella, or Swiss cheese. Low-sodium or reduced-sodium cheese. Cream cheese. Yogurt. Seasonings and condiments Fresh and dried herbs and spices. Salt-free seasonings. Low-sodium mustard and ketchup. Sodium-free salad dressing. Sodium-free light mayonnaise. Fresh or refrigerated horseradish. Lemon juice. Vinegar. Other foods Homemade, reduced-sodium, or low-sodium soups. Unsalted popcorn and pretzels. Low-salt or salt-free chips. The items listed above may not be all the foods and drinks you can have. Talk to a dietitian to learn more. What foods should I avoid? Vegetables Sauerkraut, pickled vegetables, and relishes. Olives. [place] fries. Onion rings. Regular canned vegetables, except low-sodium or reduced-sodium items. Regular canned tomato sauce and paste. Regular tomato and vegetable juice. Frozen vegetables in sauces. Grains Instant hot cereals. Bread stuffing, pancake, and biscuit mixes. Croutons. Seasoned rice or pasta mixes. Noodle soup cups. Boxed or frozen macaroni and cheese. Regular salted crackers. Self-rising flour. Meats and other proteins Meat or fish that is salted, canned, smoked, spiced, or pickled. Precooked or cured meat, such as sausages or meat loaves. Tomasa Blase. Ham. Pepperoni. Hot dogs. Corned beef. Chipped beef. Salt pork. Jerky. Pickled herring, anchovies, and sardines. Regular canned tuna. Salted nuts. Dairy Processed cheese and cheese spreads. Hard cheeses. Cheese curds. Blue cheese. Feta cheese. String cheese. Regular cottage cheese. Buttermilk. Canned milk. Fats and oils Salted butter. Regular margarine. Ghee. Bacon fat. Seasonings and  condiments Onion salt, garlic salt, seasoned salt, table salt, and sea salt. Canned and packaged gravies. Worcestershire sauce. Tartar sauce. Barbecue sauce. Teriyaki sauce. Soy sauce, including reduced-sodium soy sauce. Steak sauce. Fish sauce. Oyster sauce. Cocktail sauce. Horseradish that you find on the shelf. Regular ketchup and mustard. Meat flavorings and tenderizers. Bouillon cubes. Hot sauce. Pre-made or packaged marinades. Pre-made or packaged taco seasonings. Relishes. Regular salad dressings. Salsa. Other foods Salted popcorn and pretzels. Corn chips and puffs. Potato and tortilla chips. Canned or dried soups. Pizza. Frozen entrees and pot pies. The items listed above may not be all the foods and drinks you should avoid. Talk to a dietitian to learn more. This information is not intended to replace advice given to you by your health care provider. Make sure you discuss any questions you have with your health care provider. Document Revised: 04/08/2022 Document Reviewed: 04/08/2022 Elsevier Patient Education  2024 Elsevier Inc.  Heart-Healthy Eating Plan Many factors  influence your heart health, including eating and exercise habits. Heart health is also called coronary health. Coronary risk increases with abnormal blood fat (lipid) levels. A heart-healthy eating plan includes limiting unhealthy fats, increasing healthy fats, limiting salt (sodium) intake, and making other diet and lifestyle changes. What is my plan? Your health care provider may recommend that: You limit your fat intake to _________% or less of your total calories each day. You limit your saturated fat intake to _________% or less of your total calories each day. You limit the amount of cholesterol in your diet to less than _________ mg per day. You limit the amount of sodium in your diet to less than _________ mg per day. What are tips for following this plan? Cooking Cook foods using methods other than frying.  Baking, boiling, grilling, and broiling are all good options. Other ways to reduce fat include: Removing the skin from poultry. Removing all visible fats from meats. Steaming vegetables in water or broth. Meal planning  At meals, imagine dividing your plate into fourths: Fill one-half of your plate with vegetables and green salads. Fill one-fourth of your plate with whole grains. Fill one-fourth of your plate with lean protein foods. Eat 2-4 cups of vegetables per day. One cup of vegetables equals 1 cup (91 g) broccoli or cauliflower florets, 2 medium carrots, 1 large bell pepper, 1 large sweet potato, 1 large tomato, 1 medium white potato, 2 cups (150 g) raw leafy greens. Eat 1-2 cups of fruit per day. One cup of fruit equals 1 small apple, 1 large banana, 1 cup (237 g) mixed fruit, 1 large orange,  cup (82 g) dried fruit, 1 cup (240 mL) 100% fruit juice. Eat more foods that contain soluble fiber. Examples include apples, broccoli, carrots, beans, peas, and barley. Aim to get 25-30 g of fiber per day. Increase your consumption of legumes, nuts, and seeds to 4-5 servings per week. One serving of dried beans or legumes equals  cup (90 g) cooked, 1 serving of nuts is  oz (12 almonds, 24 pistachios, or 7 walnut halves), and 1 serving of seeds equals  oz (8 g). Fats Choose healthy fats more often. Choose monounsaturated and polyunsaturated fats, such as olive and canola oils, avocado oil, flaxseeds, walnuts, almonds, and seeds. Eat more omega-3 fats. Choose salmon, mackerel, sardines, tuna, flaxseed oil, and ground flaxseeds. Aim to eat fish at least 2 times each week. Check food labels carefully to identify foods with trans fats or high amounts of saturated fat. Limit saturated fats. These are found in animal products, such as meats, butter, and cream. Plant sources of saturated fats include palm oil, palm kernel oil, and coconut oil. Avoid foods with partially hydrogenated oils in them. These  contain trans fats. Examples are stick margarine, some tub margarines, cookies, crackers, and other baked goods. Avoid fried foods. General information Eat more home-cooked food and less restaurant, buffet, and fast food. Limit or avoid alcohol. Limit foods that are high in added sugar and simple starches such as foods made using white refined flour (white breads, pastries, sweets). Lose weight if you are overweight. Losing just 5-10% of your body weight can help your overall health and prevent diseases such as diabetes and heart disease. Monitor your sodium intake, especially if you have high blood pressure. Talk with your health care provider about your sodium intake. Try to incorporate more vegetarian meals weekly. What foods should I eat? Fruits All fresh, canned (in natural juice), or frozen fruits.  Vegetables Fresh or frozen vegetables (raw, steamed, roasted, or grilled). Green salads. Grains Most grains. Choose whole wheat and whole grains most of the time. Rice and pasta, including brown rice and pastas made with whole wheat. Meats and other proteins Lean, well-trimmed beef, veal, pork, and lamb. Chicken and [place] without skin. All fish and shellfish. Wild duck, rabbit, pheasant, and venison. Egg whites or low-cholesterol egg substitutes. Dried beans, peas, lentils, and tofu. Seeds and most nuts. Dairy Low-fat or nonfat cheeses, including ricotta and mozzarella. Skim or 1% milk (liquid, powdered, or evaporated). Buttermilk made with low-fat milk. Nonfat or low-fat yogurt. Fats and oils Non-hydrogenated (trans-free) margarines. Vegetable oils, including soybean, sesame, sunflower, olive, avocado, peanut, safflower, corn, canola, and cottonseed. Salad dressings or mayonnaise made with a vegetable oil. Beverages Water (mineral or sparkling). Coffee and tea. Unsweetened ice tea. Diet beverages. Sweets and desserts Sherbet, gelatin, and fruit ice. Small amounts of dark chocolate. Limit  all sweets and desserts. Seasonings and condiments All seasonings and condiments. The items listed above may not be a complete list of foods and beverages you can eat. Contact a dietitian for more options. What foods should I avoid? Fruits Canned fruit in heavy syrup. Fruit in cream or butter sauce. Fried fruit. Limit coconut. Vegetables Vegetables cooked in cheese, cream, or butter sauce. Fried vegetables. Grains Breads made with saturated or trans fats, oils, or whole milk. Croissants. Sweet rolls. Donuts. High-fat crackers, such as cheese crackers and chips. Meats and other proteins Fatty meats, such as hot dogs, ribs, sausage, bacon, rib-eye roast or steak. High-fat deli meats, such as salami and bologna. Caviar. Domestic duck and goose. Organ meats, such as liver. Dairy Cream, sour cream, cream cheese, and creamed cottage cheese. Whole-milk cheeses. Whole or 2% milk (liquid, evaporated, or condensed). Whole buttermilk. Cream sauce or high-fat cheese sauce. Whole-milk yogurt. Fats and oils Meat fat, or shortening. Cocoa butter, hydrogenated oils, palm oil, coconut oil, palm kernel oil. Solid fats and shortenings, including bacon fat, salt pork, lard, and butter. Nondairy cream substitutes. Salad dressings with cheese or sour cream. Beverages Regular sodas and any drinks with added sugar. Sweets and desserts Frosting. Pudding. Cookies. Cakes. Pies. Milk chocolate or white chocolate. Buttered syrups. Full-fat ice cream or ice cream drinks. The items listed above may not be a complete list of foods and beverages to avoid. Contact a dietitian for more information. Summary Heart-healthy meal planning includes limiting unhealthy fats, increasing healthy fats, limiting salt (sodium) intake and making other diet and lifestyle changes. Lose weight if you are overweight. Losing just 5-10% of your body weight can help your overall health and prevent diseases such as diabetes and heart  disease. Focus on eating a balance of foods, including fruits and vegetables, low-fat or nonfat dairy, lean protein, nuts and legumes, whole grains, and heart-healthy oils and fats. This information is not intended to replace advice given to you by your health care provider. Make sure you discuss any questions you have with your health care provider. Document Revised: 04/27/2021 Document Reviewed: 04/27/2021 Elsevier Patient Education  2024 [employer].

## 2022-09-23 NOTE — Progress Notes (Unsigned)
Applied a 7 day Zio XT monitor to patient in the office  Herbie Baltimore to read

## 2022-09-24 DIAGNOSIS — Z85828 Personal history of other malignant neoplasm of skin: Secondary | ICD-10-CM | POA: Diagnosis not present

## 2022-09-24 DIAGNOSIS — Z08 Encounter for follow-up examination after completed treatment for malignant neoplasm: Secondary | ICD-10-CM | POA: Diagnosis not present

## 2022-09-29 ENCOUNTER — Telehealth: Payer: Self-pay | Admitting: *Deleted

## 2022-09-29 DIAGNOSIS — I10 Essential (primary) hypertension: Secondary | ICD-10-CM | POA: Diagnosis not present

## 2022-09-29 DIAGNOSIS — N3281 Overactive bladder: Secondary | ICD-10-CM | POA: Diagnosis not present

## 2022-09-29 DIAGNOSIS — F419 Anxiety disorder, unspecified: Secondary | ICD-10-CM | POA: Diagnosis not present

## 2022-09-29 DIAGNOSIS — G4700 Insomnia, unspecified: Secondary | ICD-10-CM | POA: Diagnosis not present

## 2022-09-29 DIAGNOSIS — Z1331 Encounter for screening for depression: Secondary | ICD-10-CM | POA: Diagnosis not present

## 2022-09-29 DIAGNOSIS — N1831 Chronic kidney disease, stage 3a: Secondary | ICD-10-CM | POA: Diagnosis not present

## 2022-09-29 DIAGNOSIS — E669 Obesity, unspecified: Secondary | ICD-10-CM | POA: Diagnosis not present

## 2022-09-29 DIAGNOSIS — Z0000 Encounter for general adult medical examination without abnormal findings: Secondary | ICD-10-CM | POA: Diagnosis not present

## 2022-09-29 DIAGNOSIS — E7800 Pure hypercholesterolemia, unspecified: Secondary | ICD-10-CM | POA: Diagnosis not present

## 2022-09-29 NOTE — Telephone Encounter (Signed)
-----   Message from Sharlene Dory, [place] sent at 09/29/2022  8:16 AM EDT ----- [facility] ladies,  Could someone give this patient a call today and warn her that her CBD oil could be interacting with her benzo and to call her PCP ASAP?  Thanks! Sharlene Dory, PA-C  ----- Message ----- From: Olene Floss, RPH-CPP Sent: 09/28/2022   4:42 PM EDT To: Sharlene Dory, PA-C  Although I have a hard time supporting something that can be so mis regulated therefore could potentially be unsafe, the only concern with her medications I have is with her benzo which could enhance the CNS depressant effect. CV drugs don't interact per say.  ----- Message ----- From: Bunnie Domino Sent: 09/23/2022   4:29 PM EDT To: Marykay Lex, MD; Olene Floss, RPH-CPP  Patient does not like metoprolol succinate and feels tired and dizzy. We ordered a zio and changed her BB to coreg.  Melissa, she is taking CBD oil nightly for her RLS. I do not know what this interacts with if anything. She is sending a copy of the label to me on mychart and I will forward to you for review. I did warn her that this is not FDA approved and some ingredients might not be listed.   Thanks!

## 2022-09-29 NOTE — Telephone Encounter (Signed)
Lvm of cbd oil and Benzo contradictions together and reasoning to stop and talk to Pcp. Also left clinic direct number [phone] to contact back for questions or concerns.

## 2022-10-06 DIAGNOSIS — M545 Low back pain, unspecified: Secondary | ICD-10-CM | POA: Diagnosis not present

## 2022-10-11 DIAGNOSIS — R002 Palpitations: Secondary | ICD-10-CM | POA: Diagnosis not present

## 2022-10-12 DIAGNOSIS — M9905 Segmental and somatic dysfunction of pelvic region: Secondary | ICD-10-CM | POA: Diagnosis not present

## 2022-10-12 DIAGNOSIS — M9903 Segmental and somatic dysfunction of lumbar region: Secondary | ICD-10-CM | POA: Diagnosis not present

## 2022-10-12 DIAGNOSIS — M25551 Pain in right hip: Secondary | ICD-10-CM | POA: Diagnosis not present

## 2022-10-12 DIAGNOSIS — M5116 Intervertebral disc disorders with radiculopathy, lumbar region: Secondary | ICD-10-CM | POA: Diagnosis not present

## 2022-10-13 DIAGNOSIS — M25551 Pain in right hip: Secondary | ICD-10-CM | POA: Diagnosis not present

## 2022-10-13 DIAGNOSIS — M9903 Segmental and somatic dysfunction of lumbar region: Secondary | ICD-10-CM | POA: Diagnosis not present

## 2022-10-13 DIAGNOSIS — M5116 Intervertebral disc disorders with radiculopathy, lumbar region: Secondary | ICD-10-CM | POA: Diagnosis not present

## 2022-10-13 DIAGNOSIS — M9905 Segmental and somatic dysfunction of pelvic region: Secondary | ICD-10-CM | POA: Diagnosis not present

## 2022-10-18 DIAGNOSIS — M9905 Segmental and somatic dysfunction of pelvic region: Secondary | ICD-10-CM | POA: Diagnosis not present

## 2022-10-18 DIAGNOSIS — M9903 Segmental and somatic dysfunction of lumbar region: Secondary | ICD-10-CM | POA: Diagnosis not present

## 2022-10-18 DIAGNOSIS — M25551 Pain in right hip: Secondary | ICD-10-CM | POA: Diagnosis not present

## 2022-10-18 DIAGNOSIS — M5116 Intervertebral disc disorders with radiculopathy, lumbar region: Secondary | ICD-10-CM | POA: Diagnosis not present

## 2022-10-19 DIAGNOSIS — M25551 Pain in right hip: Secondary | ICD-10-CM | POA: Diagnosis not present

## 2022-10-19 DIAGNOSIS — M9903 Segmental and somatic dysfunction of lumbar region: Secondary | ICD-10-CM | POA: Diagnosis not present

## 2022-10-19 DIAGNOSIS — M9905 Segmental and somatic dysfunction of pelvic region: Secondary | ICD-10-CM | POA: Diagnosis not present

## 2022-10-19 DIAGNOSIS — M5116 Intervertebral disc disorders with radiculopathy, lumbar region: Secondary | ICD-10-CM | POA: Diagnosis not present

## 2022-10-21 DIAGNOSIS — M9905 Segmental and somatic dysfunction of pelvic region: Secondary | ICD-10-CM | POA: Diagnosis not present

## 2022-10-21 DIAGNOSIS — M5116 Intervertebral disc disorders with radiculopathy, lumbar region: Secondary | ICD-10-CM | POA: Diagnosis not present

## 2022-10-21 DIAGNOSIS — M25551 Pain in right hip: Secondary | ICD-10-CM | POA: Diagnosis not present

## 2022-10-21 DIAGNOSIS — M9903 Segmental and somatic dysfunction of lumbar region: Secondary | ICD-10-CM | POA: Diagnosis not present

## 2022-10-25 DIAGNOSIS — M9905 Segmental and somatic dysfunction of pelvic region: Secondary | ICD-10-CM | POA: Diagnosis not present

## 2022-10-25 DIAGNOSIS — M25551 Pain in right hip: Secondary | ICD-10-CM | POA: Diagnosis not present

## 2022-10-25 DIAGNOSIS — M9903 Segmental and somatic dysfunction of lumbar region: Secondary | ICD-10-CM | POA: Diagnosis not present

## 2022-10-25 DIAGNOSIS — M5116 Intervertebral disc disorders with radiculopathy, lumbar region: Secondary | ICD-10-CM | POA: Diagnosis not present

## 2022-10-26 DIAGNOSIS — M9903 Segmental and somatic dysfunction of lumbar region: Secondary | ICD-10-CM | POA: Diagnosis not present

## 2022-10-26 DIAGNOSIS — M9905 Segmental and somatic dysfunction of pelvic region: Secondary | ICD-10-CM | POA: Diagnosis not present

## 2022-10-26 DIAGNOSIS — M25551 Pain in right hip: Secondary | ICD-10-CM | POA: Diagnosis not present

## 2022-10-26 DIAGNOSIS — M5116 Intervertebral disc disorders with radiculopathy, lumbar region: Secondary | ICD-10-CM | POA: Diagnosis not present

## 2022-10-28 DIAGNOSIS — M25551 Pain in right hip: Secondary | ICD-10-CM | POA: Diagnosis not present

## 2022-10-28 DIAGNOSIS — M5116 Intervertebral disc disorders with radiculopathy, lumbar region: Secondary | ICD-10-CM | POA: Diagnosis not present

## 2022-10-28 DIAGNOSIS — M9905 Segmental and somatic dysfunction of pelvic region: Secondary | ICD-10-CM | POA: Diagnosis not present

## 2022-10-28 DIAGNOSIS — M9903 Segmental and somatic dysfunction of lumbar region: Secondary | ICD-10-CM | POA: Diagnosis not present

## 2022-11-01 DIAGNOSIS — M25551 Pain in right hip: Secondary | ICD-10-CM | POA: Diagnosis not present

## 2022-11-01 DIAGNOSIS — M5116 Intervertebral disc disorders with radiculopathy, lumbar region: Secondary | ICD-10-CM | POA: Diagnosis not present

## 2022-11-01 DIAGNOSIS — M9905 Segmental and somatic dysfunction of pelvic region: Secondary | ICD-10-CM | POA: Diagnosis not present

## 2022-11-01 DIAGNOSIS — M9903 Segmental and somatic dysfunction of lumbar region: Secondary | ICD-10-CM | POA: Diagnosis not present

## 2022-11-02 ENCOUNTER — Encounter: Payer: Self-pay | Admitting: *Deleted

## 2022-11-02 DIAGNOSIS — M9905 Segmental and somatic dysfunction of pelvic region: Secondary | ICD-10-CM | POA: Diagnosis not present

## 2022-11-02 DIAGNOSIS — M5116 Intervertebral disc disorders with radiculopathy, lumbar region: Secondary | ICD-10-CM | POA: Diagnosis not present

## 2022-11-02 DIAGNOSIS — M9903 Segmental and somatic dysfunction of lumbar region: Secondary | ICD-10-CM | POA: Diagnosis not present

## 2022-11-02 DIAGNOSIS — M25551 Pain in right hip: Secondary | ICD-10-CM | POA: Diagnosis not present

## 2022-11-04 ENCOUNTER — Encounter (HOSPITAL_COMMUNITY): Payer: Self-pay

## 2022-11-04 ENCOUNTER — Other Ambulatory Visit: Payer: Self-pay

## 2022-11-04 ENCOUNTER — Emergency Department (HOSPITAL_COMMUNITY)
Admission: EM | Admit: 2022-11-04 | Discharge: 2022-11-04 | Disposition: A | Discharge status: Home / Self Care | Payer: Medicare Other | Attending: Emergency Medicine | Admitting: Emergency Medicine

## 2022-11-04 DIAGNOSIS — R0789 Other chest pain: Secondary | ICD-10-CM | POA: Diagnosis not present

## 2022-11-04 DIAGNOSIS — M9905 Segmental and somatic dysfunction of pelvic region: Secondary | ICD-10-CM | POA: Diagnosis not present

## 2022-11-04 DIAGNOSIS — M5116 Intervertebral disc disorders with radiculopathy, lumbar region: Secondary | ICD-10-CM | POA: Diagnosis not present

## 2022-11-04 DIAGNOSIS — Z7982 Long term (current) use of aspirin: Secondary | ICD-10-CM | POA: Insufficient documentation

## 2022-11-04 DIAGNOSIS — R001 Bradycardia, unspecified: Secondary | ICD-10-CM | POA: Diagnosis not present

## 2022-11-04 DIAGNOSIS — I959 Hypotension, unspecified: Secondary | ICD-10-CM | POA: Diagnosis not present

## 2022-11-04 DIAGNOSIS — I471 Supraventricular tachycardia, unspecified: Secondary | ICD-10-CM | POA: Diagnosis not present

## 2022-11-04 DIAGNOSIS — R42 Dizziness and giddiness: Secondary | ICD-10-CM | POA: Diagnosis present

## 2022-11-04 DIAGNOSIS — R000 Tachycardia, unspecified: Secondary | ICD-10-CM | POA: Diagnosis not present

## 2022-11-04 DIAGNOSIS — M9903 Segmental and somatic dysfunction of lumbar region: Secondary | ICD-10-CM | POA: Diagnosis not present

## 2022-11-04 DIAGNOSIS — M25551 Pain in right hip: Secondary | ICD-10-CM | POA: Diagnosis not present

## 2022-11-04 LAB — CBC
HCT: 35.8 % — ABNORMAL LOW (ref 36.0–46.0)
Hemoglobin: 12 g/dL (ref 12.0–15.0)
MCH: 33.1 pg (ref 26.0–34.0)
MCHC: 33.5 g/dL (ref 30.0–36.0)
MCV: 98.6 fL (ref 80.0–100.0)
Platelets: 222 10*3/uL (ref 150–400)
RBC: 3.63 MIL/uL — ABNORMAL LOW (ref 3.87–5.11)
RDW: 12.3 % (ref 11.5–15.5)
WBC: 5.5 10*3/uL (ref 4.0–10.5)
nRBC: 0 % (ref 0.0–0.2)

## 2022-11-04 LAB — TROPONIN I (HIGH SENSITIVITY)
Troponin I (High Sensitivity): 25 ng/L — ABNORMAL HIGH (ref <18)
Troponin I (High Sensitivity): 57 ng/L — ABNORMAL HIGH (ref <18)

## 2022-11-04 LAB — BASIC METABOLIC PANEL
Anion gap: 12 (ref 5–15)
BUN: 16 mg/dL (ref 8–23)
CO2: 20 mmol/L — ABNORMAL LOW (ref 22–32)
Calcium: 8.2 mg/dL — ABNORMAL LOW (ref 8.9–10.3)
Chloride: 107 mmol/L (ref 98–111)
Creatinine, Ser: 0.92 mg/dL (ref 0.44–1.00)
GFR, Estimated: >60 mL/min (ref >60)
Glucose, Bld: 100 mg/dL — ABNORMAL HIGH (ref 70–99)
Potassium: 3.5 mmol/L (ref 3.5–5.1)
Sodium: 139 mmol/L (ref 135–145)

## 2022-11-04 LAB — MAGNESIUM: Magnesium: 1.8 mg/dL (ref 1.7–2.4)

## 2022-11-04 NOTE — ED Triage Notes (Addendum)
Pt BIB EMS due to SVT. Pt was at gym and felt dizzy. EMS states HR was 160. EMS gave 6 adenosine and 12 of adenosine. Axox4. VSS. EMS states pt had 8 runs of vtach twice.

## 2022-11-04 NOTE — Discharge Instructions (Addendum)
You were seen in the emergency room for dizziness and rapid heart rate.  You are back in normal rhythm at this time.  We suspect that you have arrhythmia call supraventricular tachycardia or SVT.  We have consulted cardiology team and they would like to see you in the clinic.  Please return to the emergency room if you start having repeat episodes with dizziness, chest pain, shortness of breath.  Expect a call from cardiology team next week for a close follow-up.

## 2022-11-04 NOTE — ED Notes (Signed)
Up to br tolerated well

## 2022-11-04 NOTE — ED Provider Notes (Addendum)
  Physical Exam  BP 138/75   Pulse 60   Temp 98.1 F (36.7 C) (Oral)   Resp 13   Ht 5\' 4"  (1.626 m)   Wt 77.6 kg   SpO2 100%   BMI 29.35 kg/m   Physical Exam  Procedures  Procedures  ED Course / MDM   Clinical Course as of 11/04/22 1622  Thu Nov 04, 2022  1528 CBC normal metabolic panel normal.  Troponin increased at 25 [JK]    Clinical Course User Index [JK] Linwood Dibbles, MD   Medical Decision Making Amount and/or Complexity of Data Reviewed Labs: ordered.    Pt comes in w/ cc of PSVT from gym. Now in sinus. She has hx of DM.  Per EMS - ? VT - no issues here.  Hx of alcohol use disorder. 2nd trop pending. If tele and trops are reassuring them can d/c.     Derwood Kaplan, MD 11/04/22 1624   5:59 PM  Pt's 2nd trop is higher. Patient remains symptom-free at this time.  She is in sinus rhythm.  I have independently reviewed the EKG rhythm strips that are present in the room.  It appears that patient was in fast heart rhythm with heart rate in the 160s.  Narrow complex.  I can see the exact time in the rhythm broke to sinus, it appears that most likely this is PSVT as I did not see any flutter waves.  I did speak with cardiology service.  Dr. Duke Salvia has also reviewed the case and recommends EP follow-up.        Derwood Kaplan, MD 11/05/22 [phone]

## 2022-11-04 NOTE — ED Notes (Signed)
Md notified of increased trop face to face

## 2022-11-04 NOTE — ED Provider Notes (Signed)
[facility] EMERGENCY DEPARTMENT AT [facility] Provider Note   CSN: [id number] Arrival date & time: 11/04/22  1231     History  Chief Complaint  Patient presents with   Chest Pain    svt    Linda Harvey is a 72 y.o. female.   Chest Pain    Pt has history of svt.  Pt was at the gym today when she felt her heart racing and she felt lightheaded.  Pt went home hoping it would break.  It did not and she was still feeling lightheaded she called ems.  Pt was given adenosine and converted to sinus rhythm.  EMS also reports the patient had a runs of V. tach during the episode.  Patient states she is feeling much better now after treatment by EMS.  Pt does have a nightcap every evening.  Had bourbon later than usual last night.  Home Medications Prior to Admission medications   Medication Sig Start Date End Date Taking? Authorizing Provider  amLODipine (NORVASC) 5 MG tablet Take 5 mg by mouth daily. 11/05/21  Yes [provider]  aspirin EC 81 MG tablet Take 81 mg by mouth daily.   Yes [provider]  buPROPion (WELLBUTRIN SR) 150 MG 12 hr tablet Take 150 mg by mouth daily. Pt takes 1 tablet daily.   Yes [provider]  carvedilol (COREG) 3.125 MG tablet Take 1 tablet (3.125 mg total) by mouth 2 (two) times daily. 09/23/22  Yes Conte, Tessa N, PA-C  diphenhydramine-acetaminophen (TYLENOL PM) 25-500 MG TABS tablet Take 2 tablets by mouth at bedtime.   Yes [provider]  lisinopril (ZESTRIL) 20 MG tablet Take 20 mg by mouth daily. 12/09/21  Yes [provider]  Multiple Vitamins-Minerals (CENTRUM SILVER 50+WOMEN) TABS Take 1 tablet by mouth daily with breakfast.   Yes [provider]  NITROSTAT 0.4 MG SL tablet Place 0.4 mg under the tongue every 5 (five) minutes as needed for chest pain. 06/11/14  Yes [provider]  OVER THE COUNTER MEDICATION Apply 1 Application topically at bedtime. To legs for restless legs. CBD Oil    Yes [provider]  oxybutynin (DITROPAN XL) 15 MG 24 hr tablet Take 30 mg by mouth at bedtime. 10/10/21  Yes [provider]  rosuvastatin (CRESTOR) 20 MG tablet Take 20 mg by mouth at bedtime. 09/28/21  Yes [provider]  TUMS 500 MG chewable tablet Chew 1-3 tablets by mouth daily as needed for indigestion or heartburn.   Yes [provider]  ZADITOR 0.025 % ophthalmic solution Place 1 drop into both eyes 2 (two) times daily as needed (for itching).   Yes [provider]  clonazePAM (KLONOPIN) 0.5 MG tablet Take 0.5 mg by mouth at bedtime. Patient not taking: Reported on 09/23/2022 09/29/21   [provider]      Allergies    Patient has no known allergies.    Review of Systems   Review of Systems  Cardiovascular:  Positive for chest pain.    Physical Exam Updated Vital Signs BP (!) 150/58   Pulse (!) 54   Temp 98.1 F (36.7 C)   Resp 18   Ht 1.626 m (5\' 4" )   Wt 77.6 kg   SpO2 100%   BMI 29.35 kg/m  Physical Exam Vitals and nursing note reviewed.  Constitutional:      General: She is not in acute distress.    Appearance: She is well-developed.  HENT:  Head: Normocephalic and atraumatic.     Right Ear: External ear normal.     Left Ear: External ear normal.  Eyes:     General: No scleral icterus.       Right eye: No discharge.        Left eye: No discharge.     Conjunctiva/sclera: Conjunctivae normal.  Neck:     Trachea: No tracheal deviation.  Cardiovascular:     Rate and Rhythm: Normal rate and regular rhythm.  Pulmonary:     Effort: Pulmonary effort is normal. No respiratory distress.     Breath sounds: Normal breath sounds. No stridor. No wheezing or rales.  Abdominal:     General: Bowel sounds are normal. There is no distension.     Palpations: Abdomen is soft.     Tenderness: There is no abdominal tenderness. There is no guarding or rebound.  Musculoskeletal:        General: No tenderness or  deformity.     Cervical back: Neck supple.  Skin:    General: Skin is warm and dry.     Findings: No rash.  Neurological:     General: No focal deficit present.     Mental Status: She is alert.     Cranial Nerves: No cranial nerve deficit, dysarthria or facial asymmetry.     Sensory: No sensory deficit.     Motor: No abnormal muscle tone or seizure activity.     Coordination: Coordination normal.  Psychiatric:        Mood and Affect: Mood normal.     ED Results / Procedures / Treatments   Labs (all labs ordered are listed, but only abnormal results are displayed) Labs Reviewed  BASIC METABOLIC PANEL - Abnormal; Notable for the following components:      Result Value   CO2 20 (*)    Glucose, Bld 100 (*)    Calcium 8.2 (*)    All other components within normal limits  CBC - Abnormal; Notable for the following components:   RBC 3.63 (*)    HCT 35.8 (*)    All other components within normal limits  TROPONIN I (HIGH SENSITIVITY) - Abnormal; Notable for the following components:   Troponin I (High Sensitivity) 25 (*)    All other components within normal limits  TROPONIN I (HIGH SENSITIVITY) - Abnormal; Notable for the following components:   Troponin I (High Sensitivity) 57 (*)    All other components within normal limits  MAGNESIUM    EKG EKG Interpretation Date/Time:  Thursday November 04 2022 12:42:05 EDT Ventricular Rate:  77 PR Interval:  157 QRS Duration:  97 QT Interval:  387 QTC Calculation: 438 R Axis:   -15  Text Interpretation: Sinus rhythm Borderline left axis deviation Abnormal R-wave progression, early transition Confirmed by Linwood Dibbles [phone]) on 11/04/2022 12:49:30 PM  Radiology No results found.  Procedures Procedures    Medications Ordered in ED Medications - No data to display  ED Course/ Medical Decision Making/ A&P Clinical Course as of 11/05/22 0711  Thu Nov 04, 2022  1528 CBC normal metabolic panel normal.  Troponin increased at 25 [JK]     Clinical Course User Index [JK] Linwood Dibbles, MD                                 Medical Decision Making Problems Addressed: PSVT (paroxysmal supraventricular tachycardia): acute illness or injury that poses  a threat to life or bodily functions  Amount and/or Complexity of Data Reviewed Labs: ordered. Decision-making details documented in ED Course.   Pt with recurrent SVT.  Pt monitored in the ED. asymptomatic during the ED stay following adenosine conversion by EMS.  Troponin slightly increased but consistent with the tachycardia.  Doubt ACS.  No signs of any acute electrolyte abnormalities.  Patient was monitored and did not have any recurrent episodes.  Discussed how alcohol could be contributing to these episodes.  Will have her follow-up with cardiology to discuss further treatment, possible ablation.  Evaluation and diagnostic testing in the emergency department does not suggest an emergent condition requiring admission or immediate intervention beyond what has been performed at this time.  The patient is safe for discharge and has been instructed to return immediately for worsening symptoms, change in symptoms or any other concerns.         Final Clinical Impression(s) / ED Diagnoses Final diagnoses:  SVT (supraventricular tachycardia)  PSVT (paroxysmal supraventricular tachycardia)    Rx / DC Orders ED Discharge Orders          Ordered    Ambulatory referral to Cardiology       Comments: If you have not heard from the Cardiology office within the next 72 hours please call [phone].   11/04/22 1851              Linwood Dibbles, MD 11/05/22 [phone]

## 2022-11-05 ENCOUNTER — Telehealth: Payer: Self-pay | Admitting: Cardiology

## 2022-11-05 NOTE — Telephone Encounter (Signed)
STAT if HR is under 50 or over 120 (normal HR is 60-100 beats per minute)  What is your heart rate? 155;165/170 (in the hospital yesterday   Today: 60,70 69  Do you have a log of your heart rate readings (document readings)?    Do you have any other symptoms? Dizziness, energy level decrease,

## 2022-11-05 NOTE — Telephone Encounter (Signed)
Returned call to pt- LVM to call back.

## 2022-11-05 NOTE — Telephone Encounter (Signed)
Called husband mobile number and pt answered. She is not "feeling herself" and was at the hospital yesterday. Her Hr is running 60"s-70"s and she is dizzy and fatigued. Please advise.  Please call [phone] as her number is not working.

## 2022-11-08 DIAGNOSIS — M25551 Pain in right hip: Secondary | ICD-10-CM | POA: Diagnosis not present

## 2022-11-08 DIAGNOSIS — M5116 Intervertebral disc disorders with radiculopathy, lumbar region: Secondary | ICD-10-CM | POA: Diagnosis not present

## 2022-11-08 DIAGNOSIS — M9903 Segmental and somatic dysfunction of lumbar region: Secondary | ICD-10-CM | POA: Diagnosis not present

## 2022-11-08 DIAGNOSIS — M9905 Segmental and somatic dysfunction of pelvic region: Secondary | ICD-10-CM | POA: Diagnosis not present

## 2022-11-08 NOTE — Progress Notes (Unsigned)
Cardiology Office Note:    Date:  11/09/2022   ID:  Linda Harvey, DOB 30-Jun-1950, MRN [id number]  PCP:  Linda Brunette, MD  Cardiologist:  Bryan Lemma, MD  Electrophysiologist:  None   Referring MD: Derwood Kaplan, MD   Chief Complaint: ED follow-up of SVT  History of Present Illness:    Linda Harvey is a 72 y.o. female with a history of mild to moderate non-obstructive CAD noted on coronary CTA in 01/2022, paroxysmal SVT, mitral regurgitation, hypertension, hyperlipidemia, and obesity who is followed by Dr. Herbie Baltimore and presents today for ED follow-up of SVT.   Patient was initially referred to Dr. Herbie Baltimore in 2016 for chest pain and dyspnea on exertion. Echo and Myoview were ordered for further evaluation. Echo showed LVEF of 55-60% with mild focal basal hypertrophy of the septum, grade 1 diastolic dysfunction, and mild to moderate TR. Myoview was low risk with no evidence of ischemia.   She was not seen  by Cardiology again until admission in 01/2022 for SVT after presented with chest tightness. She was given Adenosine and converted back to sinus rhythm. Troponin was elevated at that time but it was felt to be due to demand ischemia. Echo showed LVEF of 60-65% with grade 1 diastolic dysfunction, normal RV size and function, moderate MAC with mild MR, and mild TR. Coronary CTA was ordered and showed a coronary calcium score of 73.7 (63rd percentile for age and sex) and mild to moderate atherosclerosis of the LAD and RCA (FFR negative).   She was seen in the ED in 09/2022 for SVT. She again required Adenosine and converted back to sinus rhythm and then discharged from the ED. At follow-up visit later that month Zio monitor was ordered for further evaluation.  Her Toprol-XL was also switched to Coreg because she stated she did not feel well on the Toprol-XL. Monitor showed predominant sinus rhythm (average heart rate of 62 bpm) with rare PACs/ PVCs and 48 atrial runs (longest episode 23  beats) but no significant/ sustained arrhythmias. Symptoms were noted when patient was in sinus rhythm with rates from 51-63 bpm.   She was recently presented to the ED on 11/04/2022 with  heart racing and lightheadedness and was again found to be in SVT.  She called EMS and was found to be in SVT. Adenosine was given and she converted to sinus rhythm. EKG when back in sinus rhythm showed no acute ischemic changes. High-sensitivity troponin was minimally elevated and consistent with demand ischemia. Cardiology was curb-sided and recommended outpatient EP evaluation.   Patient presents today for follow-up. Here with her husband. Patient thinks she knows what caused her latest episode of SVT. She has a nightcap (Bourbon and Coke) every night and she gulped this down on the evening of latest episode and then went into SVT. She states she has now stopped drinking alcohol. She continued to have palpitations for a couple of days after leaving the ED but they have now calmed down. She does reports some fluttering last night when she laid down but doing okay today. She also had some persistent dizziness for a couple of days after leaving the ED; however, she is feeling better today. No syncope. She describes some left arm pain with most recent episode of SVT. She does have a history of left arm pain with exertion (primary when walking uphill) which she states she has had for 30 years. Husband states he thinks it is getting worse and worse each year; however, patient  does not think it has been any worse since she had her coronary CTA in 01/2022. No chest pain other than when she is in SVT. No significant shortness of breath.   EKGs/Labs/Other Studies Reviewed:    The following studies were reviewed:  Coronary CTA 01/10/2022: Impressions: 1. Coronary calcium score of 73.7. This was 63rd percentile for age-, sex, and race-matched controls. 2.  Normal coronary origin with right dominance. 3. Mild to moderate  atherosclerosis of the LAD and RCA. CAD RADS 2. 4.  Recommend preventive therapy and risk factor modification. 5. This study has been submitted for FFR analysis for assessment of the proximal RCA.  FFR Summary: Coronary CTA FFR analysis demonstrates no hemodynamically flow limiting lesions. _______________  Echocardiogram 01/10/2022: Impressions:  1. Left ventricular ejection fraction, by estimation, is 60 to 65%. The  left ventricle has normal function. The left ventricle has no regional  wall motion abnormalities. Left ventricular diastolic parameters are  consistent with Grade I diastolic  dysfunction (impaired relaxation).   2. Right ventricular systolic function is normal. The right ventricular  size is normal. There is normal pulmonary artery systolic pressure. The  estimated right ventricular systolic pressure is 28.0 mmHg.   3. The mitral valve is normal in structure. Mild mitral valve  regurgitation. No evidence of mitral stenosis. Moderate mitral annular  calcification.   4. The aortic valve is normal in structure. Aortic valve regurgitation is  not visualized. No aortic stenosis is present.   5. The inferior vena cava is normal in size with greater than 50%  respiratory variability, suggesting right atrial pressure of 3 mmHg.  _______________  [facility] Monitor 09/2022:   ~11 days Zio Patch Monitor (June 2024)   Predominant underlying rhythm was Sinus Rhythm with a min HR of 38 bpm, max HR of 125 bpm, and avg HR of 62 bpm   Isolated PACs were rare (<1.0%) with rare Couplets / Triplets. Isolated PVCs were  rare (<1.0%), and no VE Couplets or VE Triplets were present.   48 Atrial Runs: Fastest was 6 beats (1.7 sec) HR 160-194 bpm, Avg 174 bpm; longest 23 beats (12.4 sec) HR 83-141 bpm, Avg 112 bpm.   Symptoms were noted with sinus rhythm heart rate range 51-63 bpm   No Sustained Arrhythmias: Atrial Tachycardia (AT), Supraventricular Tachycardia (SVT), Atrial Fibrillation (A-Fib),  Atrial Flutter (A-Flutter), Sustained Ventricular Tachycardia (VT)   No AV nodal block, pauses or prolonged bradycardia  EKG:  EKG not ordered today.   Recent Labs: 12/17/2021: ALT 31 09/13/2022: TSH 1.753 11/04/2022: BUN 16; Creatinine, Ser 0.92; Hemoglobin 12.0; Magnesium 1.8; Platelets 222; Potassium 3.5; Sodium 139  Recent Lipid Panel    Component Value Date/Time   CHOL 177 10/30/2015 0538   TRIG 53 10/30/2015 0538   HDL 51 10/30/2015 0538   CHOLHDL 3.5 10/30/2015 0538   VLDL 11 10/30/2015 0538   LDLCALC 115 (H) 10/30/2015 0538    Physical Exam:    Vital Signs: BP 118/66   Pulse 75   Ht 5\' 4"  (1.626 m)   Wt 178 lb 3.2 oz (80.8 kg)   SpO2 95%   BMI 30.59 kg/m     Wt Readings from Last 3 Encounters:  11/09/22 178 lb 3.2 oz (80.8 kg)  11/04/22 171 lb (77.6 kg)  09/23/22 178 lb 9.6 oz (81 kg)     General: 72 y.o. [place] female in no acute distress. HEENT: Normocephalic and atraumatic. Sclera clear.  Neck: Supple. No JVD. Heart: RRR. Distinct S1  and S2. No murmurs, gallops, or rubs.  Lungs: No increased work of breathing. Clear to ausculation bilaterally. No wheezes, rhonchi, or rales.  Abdomen: Soft, non-distended, and non-tender to palpation.  Extremities: No lower extremity edema.    Skin: Warm and dry. Neuro: Alert and oriented x3. No focal deficits. Psych: Normal affect. Responds appropriately.  Assessment:    1. Paroxysmal SVT (supraventricular tachycardia)   2. Coronary artery disease involving native coronary artery of native heart without angina pectoris   3. Mitral valve insufficiency, unspecified etiology   4. Primary hypertension   5. Hyperlipidemia, unspecified hyperlipidemia type     Plan:    Paroxysmal SVT Patient initially diagnosed with SVT in 01/2022. Echo at that time showed normal LV function. She has had 2 ED visits over the last 2 months (most recently 11/05/2022) for symptomatic SVT requiring Adenosine both times. Recent cardiac monitor  in 09/2022 showed rare PACs/ PVCs and 48 atrial runs (longest episode 23 beats) but no significant/ sustained arrhythmias. Symptoms were noted when patient was in sinus rhythm with rates from 51-63 bpm. TSH normal in 09/2022.  - Continue to have intermittent palpitations but stable. No recurrent SVT since most recent ED visit.  - She did not tolerate Toprol-XL.Continue Coreg 3.125mg  twice daily. Heart rates are low at baseline (often in the 50s) so do not have room to up-titrate this.  - Will prescribe short acting Cardizem 30mg  to take as needed for tachy-palpitations that last longer than 5-10 minutes (okay for her to take up to twice a day). - Will refer patient to EP for consideration of an ablation.  - Discussed avoiding triggers such as alcohol and caffeine.   Non-Obstructive CAD Coronary CTA in 01/2022 showed a coronary calcium score of 73.7 (63rd percentile for age and sex) and mild to moderate atherosclerosis of the LAD and RCA (FFR negative).  - She only has chest pain when she is in SVT. However, she does report some left arm pain when walking (especially uphill). She states she has had this for over 30 years and does not think it is any worse sine her coronary CTA. - Continue aspirin and statin.  - Given chronicity of arm pain and the fact that it is stable since her coronary CTA, I don't think she needs any additional ischemic evaluation right now.   Mild Mitral Regurgitation Noted on Echo in 01/2022.  - Can continue to monitor with routine serial imaging. Consider repeat Echo in 01/2025 (earlier if needed).   Hypertension BP initially 140/74 in the office and then 118/66 on my personal recheck.  - Continue current medications: Amlodipine 5mg  daily, Lisinopril 20mg  daily, and Coreg 3.125mg  twice daily.   Hyperlipidemia Lipid panel in 09/2022: Total Cholesterol 123, Triglycerides 60, HDL 53, LDL 57. LDL goal <70 given CAD. - Continue Crestor 20mg  daily.   Disposition: Follow up in  4-6 months.   Medication Adjustments/Labs and Tests Ordered: Current medicines are reviewed at length with the patient today.  Concerns regarding medicines are outlined above.  Orders Placed This Encounter  Procedures   Ambulatory referral to Cardiac Electrophysiology   Meds ordered this encounter  Medications   diltiazem (CARDIZEM) 30 MG tablet    Sig: Take as needed for sustained heart racing/palpitations lasting 5 to 10 minutes OR up to 2 times daily.    Dispense:  60 tablet    Refill:  1    Patient Instructions  Medication Instructions:  You will begin Cardizem 30mg  for sustained heart  racing for up to 5 to 10 minutes. You may take this medication as needed OR up to 2 times daily.  *If you need a refill on your cardiac medications before your next appointment, please call your pharmacy*   Lab Work: none If you have labs (blood work) drawn today and your tests are completely normal, you will receive your results only by: MyChart Message (if you have MyChart) OR A paper copy in the mail If you have any lab test that is abnormal or we need to change your treatment, we will call you to review the results.   Testing/Procedures: none   Follow-Up: At [facility], you and your health needs are our priority.  As part of our continuing mission to provide you with exceptional heart care, we have created designated Provider Care Teams.  These Care Teams include your primary Cardiologist (physician) and Advanced Practice Providers (APPs -  Physician Assistants and Nurse Practitioners) who all work together to provide you with the care you need, when you need it.  We recommend signing up for the patient portal called "MyChart".  Sign up information is provided on this After Visit Summary.  MyChart is used to connect with patients for Virtual Visits (Telemedicine).  Patients are able to view lab/test results, encounter notes, upcoming appointments, etc.  Non-urgent messages can  be sent to your provider as well.   To learn more about what you can do with MyChart, go to [email or web address].    Your next appointment:   4 month(s)  Provider:   Bryan Lemma, MD     Other Instruct   Signed, Corrin Parker, PA-C  11/09/2022 10:00 PM    [facility]

## 2022-11-09 ENCOUNTER — Ambulatory Visit: Payer: Medicare Other | Attending: Student | Admitting: Student

## 2022-11-09 ENCOUNTER — Encounter: Payer: Self-pay | Admitting: Student

## 2022-11-09 ENCOUNTER — Telehealth: Payer: Self-pay | Admitting: Cardiology

## 2022-11-09 VITALS — BP 118/66 | HR 75 | Ht 64.0 in | Wt 178.2 lb

## 2022-11-09 DIAGNOSIS — E785 Hyperlipidemia, unspecified: Secondary | ICD-10-CM | POA: Diagnosis not present

## 2022-11-09 DIAGNOSIS — I2510 Atherosclerotic heart disease of native coronary artery without angina pectoris: Secondary | ICD-10-CM | POA: Insufficient documentation

## 2022-11-09 DIAGNOSIS — I340 Nonrheumatic mitral (valve) insufficiency: Secondary | ICD-10-CM | POA: Insufficient documentation

## 2022-11-09 DIAGNOSIS — M25551 Pain in right hip: Secondary | ICD-10-CM | POA: Diagnosis not present

## 2022-11-09 DIAGNOSIS — M5116 Intervertebral disc disorders with radiculopathy, lumbar region: Secondary | ICD-10-CM | POA: Diagnosis not present

## 2022-11-09 DIAGNOSIS — I471 Supraventricular tachycardia, unspecified: Secondary | ICD-10-CM | POA: Insufficient documentation

## 2022-11-09 DIAGNOSIS — I10 Essential (primary) hypertension: Secondary | ICD-10-CM | POA: Diagnosis not present

## 2022-11-09 DIAGNOSIS — M9905 Segmental and somatic dysfunction of pelvic region: Secondary | ICD-10-CM | POA: Diagnosis not present

## 2022-11-09 DIAGNOSIS — M9903 Segmental and somatic dysfunction of lumbar region: Secondary | ICD-10-CM | POA: Diagnosis not present

## 2022-11-09 MED ORDER — DILTIAZEM HCL 30 MG PO TABS: ORAL_TABLET | ORAL | 1 refills | Status: AC

## 2022-11-09 NOTE — Telephone Encounter (Signed)
Called pt and LVM for pt to call back.

## 2022-11-09 NOTE — Telephone Encounter (Signed)
Spoke with pt regarding a call back. Additional phone encounter started. Looks like our office was calling for more information regarding her recent visit to the ED with SVT. Pt does have an office visit scheduled to see Callie at 1:30pm this afternoon. Advised pt that we could just discuss everything at her office visit. Pt verbalizes understanding.

## 2022-11-09 NOTE — Telephone Encounter (Signed)
Follow Up:     Patientis returning a call from this morning. 

## 2022-11-09 NOTE — Patient Instructions (Signed)
Medication Instructions:  You will begin Cardizem 30mg  for sustained heart racing for up to 5 to 10 minutes. You may take this medication as needed OR up to 2 times daily.  *If you need a refill on your cardiac medications before your next appointment, please call your pharmacy*   Lab Work: none If you have labs (blood work) drawn today and your tests are completely normal, you will receive your results only by: MyChart Message (if you have MyChart) OR A paper copy in the mail If you have any lab test that is abnormal or we need to change your treatment, we will call you to review the results.   Testing/Procedures: none   Follow-Up: At [facility], you and your health needs are our priority.  As part of our continuing mission to provide you with exceptional heart care, we have created designated Provider Care Teams.  These Care Teams include your primary Cardiologist (physician) and Advanced Practice Providers (APPs -  Physician Assistants and Nurse Practitioners) who all work together to provide you with the care you need, when you need it.  We recommend signing up for the patient portal called "MyChart".  Sign up information is provided on this After Visit Summary.  MyChart is used to connect with patients for Virtual Visits (Telemedicine).  Patients are able to view lab/test results, encounter notes, upcoming appointments, etc.  Non-urgent messages can be sent to your provider as well.   To learn more about what you can do with MyChart, go to [email or web address].    Your next appointment:   4 month(s)  Provider:   Bryan Lemma, MD     Other Instruct

## 2022-11-10 NOTE — Telephone Encounter (Signed)
Tried calling pt back again. LVM for her to call back

## 2022-11-11 ENCOUNTER — Emergency Department (HOSPITAL_COMMUNITY): Payer: Medicare Other

## 2022-11-11 ENCOUNTER — Other Ambulatory Visit: Payer: Self-pay

## 2022-11-11 ENCOUNTER — Encounter (HOSPITAL_COMMUNITY): Payer: Self-pay

## 2022-11-11 ENCOUNTER — Emergency Department (HOSPITAL_COMMUNITY)
Admission: EM | Admit: 2022-11-11 | Discharge: 2022-11-11 | Disposition: A | Discharge status: Home / Self Care | Payer: Medicare Other | Attending: Emergency Medicine | Admitting: Emergency Medicine

## 2022-11-11 ENCOUNTER — Telehealth: Payer: Self-pay | Admitting: Cardiology

## 2022-11-11 DIAGNOSIS — I2510 Atherosclerotic heart disease of native coronary artery without angina pectoris: Secondary | ICD-10-CM | POA: Insufficient documentation

## 2022-11-11 DIAGNOSIS — I471 Supraventricular tachycardia, unspecified: Secondary | ICD-10-CM | POA: Diagnosis not present

## 2022-11-11 DIAGNOSIS — R001 Bradycardia, unspecified: Secondary | ICD-10-CM | POA: Diagnosis not present

## 2022-11-11 DIAGNOSIS — R000 Tachycardia, unspecified: Secondary | ICD-10-CM | POA: Diagnosis not present

## 2022-11-11 DIAGNOSIS — Z8673 Personal history of transient ischemic attack (TIA), and cerebral infarction without residual deficits: Secondary | ICD-10-CM | POA: Diagnosis not present

## 2022-11-11 DIAGNOSIS — Z7982 Long term (current) use of aspirin: Secondary | ICD-10-CM | POA: Insufficient documentation

## 2022-11-11 DIAGNOSIS — Z79899 Other long term (current) drug therapy: Secondary | ICD-10-CM | POA: Insufficient documentation

## 2022-11-11 DIAGNOSIS — I700 Atherosclerosis of aorta: Secondary | ICD-10-CM | POA: Diagnosis not present

## 2022-11-11 DIAGNOSIS — R002 Palpitations: Secondary | ICD-10-CM | POA: Diagnosis not present

## 2022-11-11 DIAGNOSIS — F419 Anxiety disorder, unspecified: Secondary | ICD-10-CM | POA: Diagnosis not present

## 2022-11-11 DIAGNOSIS — R42 Dizziness and giddiness: Secondary | ICD-10-CM | POA: Diagnosis not present

## 2022-11-11 DIAGNOSIS — I10 Essential (primary) hypertension: Secondary | ICD-10-CM | POA: Insufficient documentation

## 2022-11-11 LAB — BASIC METABOLIC PANEL
Anion gap: 13 (ref 5–15)
BUN: 19 mg/dL (ref 8–23)
CO2: 19 mmol/L — ABNORMAL LOW (ref 22–32)
Calcium: 8.9 mg/dL (ref 8.9–10.3)
Chloride: 108 mmol/L (ref 98–111)
Creatinine, Ser: 0.84 mg/dL (ref 0.44–1.00)
GFR, Estimated: >60 mL/min (ref >60)
Glucose, Bld: 93 mg/dL (ref 70–99)
Potassium: 4.1 mmol/L (ref 3.5–5.1)
Sodium: 140 mmol/L (ref 135–145)

## 2022-11-11 LAB — CBC
HCT: 36.4 % (ref 36.0–46.0)
Hemoglobin: 12.4 g/dL (ref 12.0–15.0)
MCH: 34 pg (ref 26.0–34.0)
MCHC: 34.1 g/dL (ref 30.0–36.0)
MCV: 99.7 fL (ref 80.0–100.0)
Platelets: 150 10*3/uL (ref 150–400)
RBC: 3.65 MIL/uL — ABNORMAL LOW (ref 3.87–5.11)
RDW: 12.1 % (ref 11.5–15.5)
WBC: 6 10*3/uL (ref 4.0–10.5)
nRBC: 0 % (ref 0.0–0.2)

## 2022-11-11 LAB — TROPONIN I (HIGH SENSITIVITY)
Troponin I (High Sensitivity): 9 ng/L (ref <18)
Troponin I (High Sensitivity): 26 ng/L — ABNORMAL HIGH (ref <18)

## 2022-11-11 MED ORDER — LORAZEPAM 1 MG PO TABS: 1.0000 mg | ORAL_TABLET | Freq: Two times a day (BID) | ORAL | 0 refills | Status: DC | PRN

## 2022-11-11 NOTE — Telephone Encounter (Signed)
Pt returning nurses message. Please advise.

## 2022-11-11 NOTE — Telephone Encounter (Signed)
Please see other encounter.

## 2022-11-11 NOTE — Telephone Encounter (Signed)
Returned call to pt. Pt is not feeling herself still. Pt. Is having palpatations that comes and goes. No chest pain or pressure. Pt is SOB and dizziness comes and goes. Not lightheaded. Her HR is 49 and BP is 136/90 HR is 56. No  new medications or discontinued medications. Bending over makes her unstable. Advised pt that she should not drive while dizzy. She can see her palpatations through her shirt.   Please [phone] as her number [phone] do not call because it is not working.

## 2022-11-11 NOTE — Telephone Encounter (Signed)
Call into triage for urgent issue. Patient Shortness of breath has gotten worse since call this morning. States  HR went to 175.She took diltiazem, 2 pills, she states she was told to do this.  She states HR elevated approx 8-10 minutes at this rate.  BP 119/69  HR 147 at present. Pain going down left arm. Extremely Dizzy as well.   Even though medication taken, the HR elevation along with new pain radiating down left arm and audible SOB while on phone, advised to go to the ED.  Her husband is going to call EMS as he is very anxious as well.

## 2022-11-11 NOTE — Telephone Encounter (Signed)
Called pt LVM to call back

## 2022-11-11 NOTE — Telephone Encounter (Signed)
Pt c/o Shortness Of Breath: STAT if SOB developed within the last 24 hours or pt is noticeably SOB on the phone  1. Are you currently SOB (can you hear that pt is SOB on the phone)? yes  2. How long have you been experiencing SOB? This morning  3. Are you SOB when sitting or when up moving around?    4. Are you currently experiencing any other symptoms? Left arm is hurting

## 2022-11-11 NOTE — ED Provider Notes (Signed)
[facility] AT [facility] Provider Note   CSN: [id number] Arrival date & time: 11/11/22  1455     History  Chief Complaint  Patient presents with   Tachycardia    Linda Harvey is a 72 y.o. female.  Pt is a 72 yo female with pmhx significant for mild to moderate non-obstructive CAD noted on coronary CTA in 01/2022, paroxysmal SVT, mitral regurgitation, hypertension, hyperlipidemia, and obesity.  Pt has had several recent episodes of SVT.  She feels like she can't do anything anymore because her heart.  She felt dizzy this am.  She sat down and her watch said her HR was 176.  She took 2 diltiazem pills to try to slow her HR down, but it did not.  EMS was called and they found her still to be in SVT.  She was given 6 mg adenosine w/o change, then 12 mg.  She is now in NSR and feels better.         Home Medications Prior to Admission medications   Medication Sig Start Date End Date Taking? Authorizing Provider  amLODipine (NORVASC) 5 MG tablet Take 5 mg by mouth daily. 11/05/21  Yes [provider]  aspirin EC 81 MG tablet Take 81 mg by mouth daily.   Yes [provider]  buPROPion (WELLBUTRIN SR) 150 MG 12 hr tablet Take 150 mg by mouth daily. Pt takes 1 tablet daily.   Yes [provider]  carvedilol (COREG) 3.125 MG tablet Take 1 tablet (3.125 mg total) by mouth 2 (two) times daily. 09/23/22  Yes Conte, Tessa N, PA-C  diltiazem (CARDIZEM) 30 MG tablet Take as needed for sustained heart racing/palpitations lasting 5 to 10 minutes OR up to 2 times daily. 11/09/22  Yes [name] E, PA-C  lisinopril (ZESTRIL) 20 MG tablet Take 20 mg by mouth daily. 12/09/21  Yes [provider]  LORazepam (ATIVAN) 1 MG tablet Take 1 tablet (1 mg total) by mouth 2 (two) times daily as needed for anxiety. 11/11/22  Yes Jacalyn Lefevre, MD  Multiple Vitamins-Minerals (CENTRUM SILVER 50+WOMEN) TABS Take 1 tablet by mouth daily with breakfast.    Yes [provider]  NITROSTAT 0.4 MG SL tablet Place 0.4 mg under the tongue every 5 (five) minutes as needed for chest pain. 06/11/14  Yes [provider]  OVER THE COUNTER MEDICATION Apply 1 Application topically at bedtime. To legs for restless legs. CBD Oil   Yes [provider]  oxybutynin (DITROPAN XL) 15 MG 24 hr tablet Take 30 mg by mouth at bedtime. 10/10/21  Yes [provider]  rosuvastatin (CRESTOR) 20 MG tablet Take 20 mg by mouth at bedtime. 09/28/21  Yes [provider]  TUMS 500 MG chewable tablet Chew 1-3 tablets by mouth daily as needed for indigestion or heartburn.   Yes [provider]  ZADITOR 0.025 % ophthalmic solution Place 1 drop into both eyes 2 (two) times daily as needed (for itching).   Yes [provider]      Allergies    Patient has no known allergies.    Review of Systems   Review of Systems  Cardiovascular:  Positive for palpitations.  All other systems reviewed and are negative.   Physical Exam Updated Vital Signs BP (!) 148/75 (BP Location: Right Arm)   Pulse (!) 56   Temp 97.9 F (36.6 C) (Oral)   Resp (!) 21   Ht 5\' 4"  (1.626 m)   Wt  80.7 kg   SpO2 98%   BMI 30.55 kg/m  Physical Exam Vitals and nursing note reviewed.  Constitutional:      Appearance: Normal appearance.  HENT:     Head: Normocephalic and atraumatic.     Right Ear: External ear normal.     Left Ear: External ear normal.     Nose: Nose normal.     Mouth/Throat:     Mouth: Mucous membranes are moist.     Pharynx: Oropharynx is clear.  Eyes:     Extraocular Movements: Extraocular movements intact.     Conjunctiva/sclera: Conjunctivae normal.     Pupils: Pupils are equal, round, and reactive to light.  Cardiovascular:     Rate and Rhythm: Normal rate and regular rhythm.     Pulses: Normal pulses.     Heart sounds: Normal heart sounds.  Pulmonary:     Effort: Pulmonary effort is normal.     Breath sounds:  Normal breath sounds.  Abdominal:     General: Abdomen is flat. Bowel sounds are normal.     Palpations: Abdomen is soft.  Musculoskeletal:        General: Normal range of motion.     Cervical back: Normal range of motion and neck supple.  Skin:    General: Skin is warm.     Capillary Refill: Capillary refill takes less than 2 seconds.  Neurological:     General: No focal deficit present.     Mental Status: She is alert and oriented to person, place, and time.  Psychiatric:        Mood and Affect: Mood normal.        Behavior: Behavior normal.     ED Results / Procedures / Treatments   Labs (all labs ordered are listed, but only abnormal results are displayed) Labs Reviewed  CBC - Abnormal; Notable for the following components:      Result Value   RBC 3.65 (*)    All other components within normal limits  BASIC METABOLIC PANEL - Abnormal; Notable for the following components:   CO2 19 (*)    All other components within normal limits  TROPONIN I (HIGH SENSITIVITY) - Abnormal; Notable for the following components:   Troponin I (High Sensitivity) 26 (*)    All other components within normal limits  TROPONIN I (HIGH SENSITIVITY)    EKG None  Radiology DG Chest Port 1 View  Result Date: 11/11/2022 CLINICAL DATA:  Supraventricular tachycardia. EXAM: PORTABLE CHEST 1 VIEW COMPARISON:  Radiographs 09/13/2022, 01/09/2022 and 06/12/2020. Cardiac CT 01/10/2022. FINDINGS: 1508 hours. The heart size and mediastinal contours are stable with mild aortic atherosclerosis. The lungs are main clear. No pleural effusion or pneumothorax. No acute osseous findings are evident. Telemetry leads overlie the chest. IMPRESSION: No active cardiopulmonary process. Aortic atherosclerosis. Electronically Signed   By: Carey Bullocks M.D.   On: 11/11/2022 15:15    Procedures Procedures    Medications Ordered in ED Medications - No data to display  ED Course/ Medical Decision Making/ A&P                                  Medical Decision Making Amount and/or Complexity of Data Reviewed Labs: ordered. Radiology: ordered.  Risk Prescription drug management.   This patient presents to the ED for concern of palpitations, this involves an extensive number of treatment options, and is a complaint that carries  with it a high risk of complications and morbidity.  The differential diagnosis includes svt, afib with rvr, electrolyte abn   Co morbidities that complicate the patient evaluation  mild to moderate non-obstructive CAD noted on coronary CTA in 01/2022, paroxysmal SVT, mitral regurgitation, hypertension, hyperlipidemia, and obesity   Additional history obtained:  Additional history obtained from epic chart review External records from outside source obtained and reviewed including EMS report   Lab Tests:  I Ordered, and personally interpreted labs.  The pertinent results include:  cbc nl, bmp nl, trop with a slight bump at 26.   Imaging Studies ordered:  I ordered imaging studies including cxr  I independently visualized and interpreted imaging which showed No active cardiopulmonary process. Aortic atherosclerosis.  I agree with the radiologist interpretation   Cardiac Monitoring:  The patient was maintained on a cardiac monitor.  I personally viewed and interpreted the cardiac monitored which showed an underlying rhythm of: nsr   Medicines ordered and prescription drug management:   I have reviewed the patients home medicines and have made adjustments as needed   Consultations Obtained:  I requested consultation with the cardiologist,  and discussed lab and imaging findings as well as pertinent plan - they recommend d/c.  They will set her up with EP next week.   Problem List / ED Course:  SVT:  resolved.  Pt to f/u with Dr. Elberta Fortis next week.  Pt can't go up on her bblocker as her HR stays in the 50s normally.  Pt said she's stopped drinking any alcohol.   She is given techniques to try to break the SVT.  Anxiety:  pt is extremely anxious.  She is afraid to go to sleep and go out of the house.  She will be given a short course of ativan (#10) until she can see EP.     Reevaluation:  After the interventions noted above, I reevaluated the patient and found that they have :improved   Social Determinants of Health:  Lives at home   Dispostion:  After consideration of the diagnostic results and the patients response to treatment, I feel that the patent would benefit from discharge with outpatient f/u.          Final Clinical Impression(s) / ED Diagnoses Final diagnoses:  SVT (supraventricular tachycardia)  Anxiety    Rx / DC Orders ED Discharge Orders          Ordered    LORazepam (ATIVAN) 1 MG tablet  2 times daily PRN        11/11/22 1951              Jacalyn Lefevre, MD 11/11/22 1954

## 2022-11-11 NOTE — ED Triage Notes (Signed)
Patient brought in by ems due to dizzy and chest pain this am.  Was found to be in SVT 160-180 was given 6mg  of adenosine and converted but then went back into SVT was given 12mg  adenosine and now SR.

## 2022-11-12 NOTE — Telephone Encounter (Signed)
Patient was seen in the ER

## 2022-11-13 ENCOUNTER — Other Ambulatory Visit: Payer: Self-pay

## 2022-11-13 ENCOUNTER — Observation Stay (HOSPITAL_COMMUNITY)
Admission: EM | Admit: 2022-11-13 | Discharge: 2022-11-14 | Disposition: A | Discharge status: Home / Self Care | Payer: Medicare Other | Attending: Internal Medicine | Admitting: Internal Medicine

## 2022-11-13 ENCOUNTER — Encounter (HOSPITAL_COMMUNITY): Payer: Self-pay | Admitting: *Deleted

## 2022-11-13 DIAGNOSIS — I499 Cardiac arrhythmia, unspecified: Secondary | ICD-10-CM | POA: Diagnosis not present

## 2022-11-13 DIAGNOSIS — R002 Palpitations: Secondary | ICD-10-CM | POA: Diagnosis present

## 2022-11-13 DIAGNOSIS — I471 Supraventricular tachycardia, unspecified: Principal | ICD-10-CM | POA: Insufficient documentation

## 2022-11-13 DIAGNOSIS — Z7982 Long term (current) use of aspirin: Secondary | ICD-10-CM | POA: Insufficient documentation

## 2022-11-13 DIAGNOSIS — I469 Cardiac arrest, cause unspecified: Secondary | ICD-10-CM | POA: Diagnosis not present

## 2022-11-13 DIAGNOSIS — I959 Hypotension, unspecified: Secondary | ICD-10-CM | POA: Diagnosis not present

## 2022-11-13 DIAGNOSIS — R0789 Other chest pain: Secondary | ICD-10-CM | POA: Diagnosis not present

## 2022-11-13 DIAGNOSIS — I10 Essential (primary) hypertension: Secondary | ICD-10-CM | POA: Diagnosis not present

## 2022-11-13 DIAGNOSIS — E785 Hyperlipidemia, unspecified: Secondary | ICD-10-CM | POA: Diagnosis not present

## 2022-11-13 DIAGNOSIS — R000 Tachycardia, unspecified: Secondary | ICD-10-CM | POA: Diagnosis not present

## 2022-11-13 DIAGNOSIS — I2510 Atherosclerotic heart disease of native coronary artery without angina pectoris: Secondary | ICD-10-CM | POA: Insufficient documentation

## 2022-11-13 DIAGNOSIS — Z79899 Other long term (current) drug therapy: Secondary | ICD-10-CM | POA: Insufficient documentation

## 2022-11-13 LAB — CBC WITH DIFFERENTIAL/PLATELET
Abs Immature Granulocytes: 0.02 10*3/uL (ref 0.00–0.07)
Basophils Absolute: 0 10*3/uL (ref 0.0–0.1)
Basophils Relative: 1 %
Eosinophils Absolute: 0.1 10*3/uL (ref 0.0–0.5)
Eosinophils Relative: 2 %
HCT: 38.7 % (ref 36.0–46.0)
Hemoglobin: 12.8 g/dL (ref 12.0–15.0)
Immature Granulocytes: 0 %
Lymphocytes Relative: 36 %
Lymphs Abs: 1.9 10*3/uL (ref 0.7–4.0)
MCH: 32.7 pg (ref 26.0–34.0)
MCHC: 33.1 g/dL (ref 30.0–36.0)
MCV: 98.7 fL (ref 80.0–100.0)
Monocytes Absolute: 0.5 10*3/uL (ref 0.1–1.0)
Monocytes Relative: 9 %
Neutro Abs: 2.7 10*3/uL (ref 1.7–7.7)
Neutrophils Relative %: 52 %
Platelets: 242 10*3/uL (ref 150–400)
RBC: 3.92 MIL/uL (ref 3.87–5.11)
RDW: 12.1 % (ref 11.5–15.5)
WBC: 5.2 10*3/uL (ref 4.0–10.5)
nRBC: 0 % (ref 0.0–0.2)

## 2022-11-13 LAB — BASIC METABOLIC PANEL
Anion gap: 14 (ref 5–15)
BUN: 19 mg/dL (ref 8–23)
CO2: 23 mmol/L (ref 22–32)
Calcium: 9.1 mg/dL (ref 8.9–10.3)
Chloride: 104 mmol/L (ref 98–111)
Creatinine, Ser: 1.04 mg/dL — ABNORMAL HIGH (ref 0.44–1.00)
GFR, Estimated: 57 mL/min — ABNORMAL LOW (ref >60)
Glucose, Bld: 98 mg/dL (ref 70–99)
Potassium: 3.8 mmol/L (ref 3.5–5.1)
Sodium: 141 mmol/L (ref 135–145)

## 2022-11-13 LAB — MAGNESIUM: Magnesium: 2.1 mg/dL (ref 1.7–2.4)

## 2022-11-13 LAB — TSH: TSH: 2.199 u[IU]/mL (ref 0.350–4.500)

## 2022-11-13 MED ORDER — ASPIRIN 81 MG PO TBEC
81.0000 mg | DELAYED_RELEASE_TABLET | Freq: Every day | ORAL | Status: DC
  Administered 2022-11-14: 81 mg via ORAL
  Filled 2022-11-13: qty 1

## 2022-11-13 MED ORDER — LISINOPRIL 20 MG PO TABS
20.0000 mg | ORAL_TABLET | Freq: Every day | ORAL | Status: DC
  Administered 2022-11-14: 20 mg via ORAL
  Filled 2022-11-13: qty 1

## 2022-11-13 MED ORDER — ONDANSETRON HCL 4 MG/2ML IJ SOLN: 4.0000 mg | Freq: Four times a day (QID) | INTRAMUSCULAR | Status: DC | PRN

## 2022-11-13 MED ORDER — AMLODIPINE BESYLATE 5 MG PO TABS: 5.0000 mg | ORAL_TABLET | Freq: Every day | ORAL | Status: DC

## 2022-11-13 MED ORDER — CALCIUM CARBONATE ANTACID 500 MG PO CHEW: 1.0000 | CHEWABLE_TABLET | Freq: Every day | ORAL | Status: DC | PRN

## 2022-11-13 MED ORDER — ROSUVASTATIN CALCIUM 20 MG PO TABS
20.0000 mg | ORAL_TABLET | Freq: Every day | ORAL | Status: DC
  Administered 2022-11-13: 20 mg via ORAL
  Filled 2022-11-13: qty 1

## 2022-11-13 MED ORDER — ENOXAPARIN SODIUM 40 MG/0.4ML IJ SOSY
40.0000 mg | PREFILLED_SYRINGE | INTRAMUSCULAR | Status: DC
  Administered 2022-11-13: 40 mg via SUBCUTANEOUS
  Filled 2022-11-13: qty 0.4

## 2022-11-13 MED ORDER — OXYBUTYNIN CHLORIDE ER 10 MG PO TB24
30.0000 mg | ORAL_TABLET | Freq: Every day | ORAL | Status: DC
  Administered 2022-11-13: 30 mg via ORAL
  Filled 2022-11-13 (×2): qty 3

## 2022-11-13 MED ORDER — BUPROPION HCL ER (SR) 150 MG PO TB12
150.0000 mg | ORAL_TABLET | Freq: Every day | ORAL | Status: DC
  Administered 2022-11-14: 150 mg via ORAL
  Filled 2022-11-13: qty 1

## 2022-11-13 MED ORDER — ADULT MULTIVITAMIN W/MINERALS CH
1.0000 | ORAL_TABLET | Freq: Every day | ORAL | Status: DC
  Administered 2022-11-14: 1 via ORAL
  Filled 2022-11-13: qty 1

## 2022-11-13 MED ORDER — ACETAMINOPHEN 325 MG PO TABS
650.0000 mg | ORAL_TABLET | ORAL | Status: DC | PRN
  Administered 2022-11-14: 650 mg via ORAL
  Filled 2022-11-13: qty 2

## 2022-11-13 MED ORDER — DILTIAZEM HCL 30 MG PO TABS
30.0000 mg | ORAL_TABLET | Freq: Four times a day (QID) | ORAL | Status: DC
  Administered 2022-11-13 – 2022-11-14 (×3): 30 mg via ORAL
  Filled 2022-11-13 (×5): qty 1

## 2022-11-13 NOTE — ED Triage Notes (Signed)
The pt arrived by [facility] from home  hx svt  she was here yesterday and had an episode of  svt was seen here last pm  today her heart started beating very fast today and she converted to a nsr when ems arrived at her house  she iwants an ablation she is tired of going through this and she reports that she feels like that she is not being taken care of

## 2022-11-13 NOTE — ED Provider Notes (Signed)
[place] EMERGENCY DEPARTMENT AT [facility] Provider Note  MDM   HPI/ROS:  Linda Harvey is a 72 y.o. female with history of SVT presenting with chief complaint of rapid heart rate.  Patient has been seen multiple times in the ED for SVT in the past 2 weeks, and expresses extreme frustration with the fact that she has had to return to the emergency department multiple times and that issue continues to persist.  Today, patient expresses that she had 5 episodes of SVT with the first 4 terminating following vagal maneuvers shortly followed by recurrent symptoms.  The last episode self terminated after EMS picked her up.  Patient is expressing extreme frustration and would like to speak with a cardiologist now.  She currently has an appointment with cardiology this coming Friday for evaluation for ablation; however, patient feels that she cannot live her life this way and wants immediate treatment.  She also expresses shortness of breath and chest pain that her present during these episodes and subside afterwards.  She has been prescribed metoprolol and diltiazem in the past, and was most recently changed from metoprolol to Coreg 3.125 twice daily.  Reports compliance with his medication.  Physical exam is notable for: - Overall well-appearing, no acute distress -  On my initial evaluation, patient is:  -Vital signs stable.*** Patient afebrile***, hemodynamically stable***, and non-toxic appearing.*** -Additional history obtained from ***  Given the patient's  Interpretations, interventions, and the patient's course of care are documented below.      ***   Disposition:  {ED Dispo:29898}  Clinical Impression: No diagnosis found.  Rx / DC Orders ED Discharge Orders     None       The plan for this patient was discussed with Dr. ***, who voiced agreement and who oversaw evaluation and treatment of this patient.   Clinical Complexity A medically appropriate history,  review of systems, and physical exam was performed.  My independent interpretations of EKG, labs, and radiology are documented in the ED course above.   Click here for ABCD2, HEART and other calculatorsREFRESH Note before signing   Patient's presentation is most consistent with {EM COPA:27473}  Medical Decision Making Amount and/or Complexity of Data Reviewed Labs: ordered.  Risk Decision regarding hospitalization.    HPI/ROS      See MDM section for pertinent HPI and ROS. A complete ROS was performed with pertinent positives/negatives noted above.   Past Medical History:  Diagnosis Date   Essential hypertension    Heart murmur    per patient report   Hyperlipidemia    Hypertension    Insomnia    NSTEMI (non-ST elevated myocardial infarction) (HCC) 01/09/2022   Obesity (BMI 35.0-39.9 without comorbidity)    Overactive bladder    PSVT (paroxysmal supraventricular tachycardia) 01/10/2022   Severe Acute Febrile Illness as Child     Past Surgical History:  Procedure Laterality Date   NM MYOVIEW LTD  06/25/14   Low Risk ST - normal perfusion, abnormal GXT, EF ~70%   TRANSTHORACIC ECHOCARDIOGRAM  06/25/14    LV size & function - EF 55-60%, Gr 1 DD, mild-mod TR, Tr MR; Mild Aortic Sclerosis   TUBAL LIGATION Bilateral       Physical Exam   Vitals:   11/13/22 1900 11/13/22 1902 11/13/22 1915 11/13/22 1930  BP: 133/81  (!) 143/92 131/71  Pulse: 73  74 64  Resp: (!) 21  (!) 22 16  SpO2: 99%  100% 100%  Weight:  80.7 kg    Height:  5\' 4"  (1.626 m)      Physical Exam   Procedures    Procedures   Starleen Arms, MD Department of Emergency Medicine   Please note that this documentation was produced with the assistance of voice-to-text technology and may contain errors.

## 2022-11-13 NOTE — H&P (Signed)
Cardiology Admission History and Physical   Harvey ID: Linda Harvey MRN: [id number]; DOB: 11/21/1950   Admission date: 11/13/2022  PCP:  Merri Brunette, MD   [facility] Providers Cardiologist:  Bryan Lemma, MD        Chief Complaint:  Tachycardia  Harvey Profile:   Linda Harvey is a 72 y.o. female with PSVT, HTN, HLD, obesity, and non-obstructive CAD who is being seen 11/13/2022 for Linda evaluation of tachycardia.  History of Present Illness:   Linda Harvey has been dealing with episodes of SVT since October when she was hospitalized. During that hospitalization, she was noted to have Hrs in Linda 160s and her EKG showed SVT. Workup at that time with labs and a TTE were unremarkable and a coronary CTA showed non-obstructive CAD. Since this hospitalization she has continued to have intermittent episodes of tachycardia as identified by her Apple Watch. She has presented to Linda ED twice on 6/10 and on 8/1 for recurrence of her SVT. During both of her ED visits she was successfully converted to NSR with adenosine and discharged home.  Today Linda Harvey was in her USOH when she developed sudden onset palpitations, chest pain radiating to her L arm and lightheadedness. Her Apple Watch notified her that her HR was elevated between 160-170. She tried several vagal maneuvers included bearing down, blowing through a straw and submerging her face in water. Her HRs would improve briefly, but then her tachycardia would return. This happened 4x over Linda course of Linda day. Given Linda persistent of her symptoms she called EMS. Her SVT resolved prior to EMS arrival. She denies active chest pain, SOB, weakness, palpitations, syncope, presyncope, N/V/D or abdominal pain.  Of note Linda Harvey reports having increased ETOH intake, but has stopped since developing SVT. Her last ETOH drink was on Monday. She previously consumed at least 1 ETOH beverage nightly and family thinks even more. She  denies tobacco use. Uses CBD but no illicit drugs. She takes coreg 3.125 mg BID and prn diltiazem for rate control. She was previously on metoprolol XL but felt poorly on it so it was switched to coreg. No further complaints and she endorses being Full Code.    Past Medical History:  Diagnosis Date   Essential hypertension    Heart murmur    per [name]   Hyperlipidemia    Hypertension    Insomnia    NSTEMI (non-ST elevated myocardial infarction) (HCC) 01/09/2022   Obesity (BMI 35.0-39.9 without comorbidity)    Overactive bladder    PSVT (paroxysmal supraventricular tachycardia) 01/10/2022   Severe Acute Febrile Illness as Child     Past Surgical History:  Procedure Laterality Date   NM MYOVIEW LTD  06/25/14   Low Risk ST - normal perfusion, abnormal GXT, EF ~70%   TRANSTHORACIC ECHOCARDIOGRAM  06/25/14    LV size & function - EF 55-60%, Gr 1 DD, mild-mod TR, Tr MR; Mild Aortic Sclerosis   TUBAL LIGATION Bilateral      Medications Prior to Admission: Prior to Admission medications   Medication Sig Start Date End Date Taking? Authorizing Provider  amLODipine (NORVASC) 5 MG tablet Take 5 mg by mouth daily. 11/05/21   [provider]  aspirin EC 81 MG tablet Take 81 mg by mouth daily.    [provider]  buPROPion (WELLBUTRIN SR) 150 MG 12 hr tablet Take 150 mg by mouth daily. Pt takes 1 tablet daily.    [provider]  carvedilol (COREG)  3.125 MG tablet Take 1 tablet (3.125 mg total) by mouth 2 (two) times daily. 09/23/22   Sharlene Dory, PA-C  diltiazem (CARDIZEM) 30 MG tablet Take as needed for sustained heart racing/palpitations lasting 5 to 10 minutes OR up to 2 times daily. 11/09/22   [name] E, PA-C  lisinopril (ZESTRIL) 20 MG tablet Take 20 mg by mouth daily. 12/09/21   [provider]  LORazepam (ATIVAN) 1 MG tablet Take 1 tablet (1 mg total) by mouth 2 (two) times daily as needed for anxiety. 11/11/22   Jacalyn Lefevre, MD  Multiple  Vitamins-Minerals (CENTRUM SILVER 50+WOMEN) TABS Take 1 tablet by mouth daily with breakfast.    [provider]  NITROSTAT 0.4 MG SL tablet Place 0.4 mg under Linda tongue every 5 (five) minutes as needed for chest pain. 06/11/14   [provider]  OVER Linda COUNTER MEDICATION Apply 1 Application topically at bedtime. To legs for restless legs. CBD Oil    [provider]  oxybutynin (DITROPAN XL) 15 MG 24 hr tablet Take 30 mg by mouth at bedtime. 10/10/21   [provider]  rosuvastatin (CRESTOR) 20 MG tablet Take 20 mg by mouth at bedtime. 09/28/21   [provider]  TUMS 500 MG chewable tablet Chew 1-3 tablets by mouth daily as needed for indigestion or heartburn.    [provider]  ZADITOR 0.025 % ophthalmic solution Place 1 drop into both eyes 2 (two) times daily as needed (for itching).    [provider]     Allergies:   No Known Allergies  Social History:   Social History   Socioeconomic History   Marital status: Married    Spouse name: Not on file   Number of children: Not on file   Years of education: Not on file   Highest education level: Not on file  Occupational History   Not on file  Tobacco Use   Smoking status: Never   Smokeless tobacco: Never  Vaping Use   Vaping status: Never Used  Substance and Sexual Activity   Alcohol use: Yes    Alcohol/week: 7.0 standard drinks of alcohol    Types: 7 Glasses of wine per week    Comment: 1-2 glasses wine nightly per pt report   Drug use: Yes    Comment: CBD for restless legs   Sexual activity: Yes    Birth control/protection: None  Other Topics Concern   Not on file  Social History Narrative   Lives with he husband - likes to go walking with him   Notes that has not had any "sex drive" x ~2 yrs.   Never smoked.  No EtOH.    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   Social Determinants of Health   Financial Resource Strain: Not on file  Food Insecurity: Not on file   Transportation Needs: Not on file  Physical Activity: Not on file  Stress: Not on file  Social Connections: Unknown (08/16/2021)   Received from [facility]   Social Network    Social Network: Not on file  Intimate Partner Violence: Unknown (07/08/2021)   Received from [facility]   HITS    Physically Hurt: Not on file    Insult or Talk Down To: Not on file    Threaten Physical Harm: Not on file    Scream or Curse: Not on file    Family History:   Linda Harvey's family history includes Cancer in her father; Diabetes  in her mother, sister, and son; Epilepsy in her son.    ROS:  Please see Linda history of present illness.  All other ROS reviewed and negative.     Physical Exam/Data:   Vitals:   11/13/22 1900 11/13/22 1902 11/13/22 1915 11/13/22 1930  BP: 133/81  (!) 143/92 131/71  Pulse: 73  74 64  Resp: (!) 21  (!) 22 16  SpO2: 99%  100% 100%  Weight:  80.7 kg    Height:  5\' 4"  (1.626 m)     No intake or output data in Linda 24 hours ending 11/13/22 2148    11/13/2022    7:02 PM 11/11/2022    2:58 PM 11/09/2022    1:39 PM  Last 3 Weights  Weight (lbs) 177 lb 14.6 oz 178 lb 178 lb 3.2 oz  Weight (kg) 80.7 kg 80.74 kg 80.831 kg     Body mass index is 30.54 kg/m.  General:  Well nourished, well developed, in no acute distress HEENT: normal Neck: no JVD Vascular: No carotid bruits; Distal pulses 2+ bilaterally   Cardiac:  normal S1, S2; RRR; no rubs or gallops. II/VI systolic murmur loudest at LLSB. Lungs:  clear to auscultation bilaterally, no wheezing, rhonchi or rales  Abd: soft, nontender, no hepatomegaly  Ext: no edema Musculoskeletal:  No deformities, BUE and BLE strength normal and equal Skin: warm and dry  Neuro:  CNs 2-12 intact, no focal abnormalities noted Psych:  Normal affect    EKG:       Relevant CV Studies:  TTE 01/10/22:  IMPRESSIONS     1. Left ventricular ejection fraction, by estimation, is 60 to 65%. Linda  left ventricle has normal  function. Linda left ventricle has no regional  wall motion abnormalities. Left ventricular diastolic parameters are  consistent with Grade I diastolic  dysfunction (impaired relaxation).   2. Right ventricular systolic function is normal. Linda right ventricular  size is normal. There is normal pulmonary artery systolic pressure. Linda  estimated right ventricular systolic pressure is 28.0 mmHg.   3. Linda mitral valve is normal in structure. Mild mitral valve  regurgitation. No evidence of mitral stenosis. Moderate mitral annular  calcification.   4. Linda aortic valve is normal in structure. Aortic valve regurgitation is  not visualized. No aortic stenosis is present.   5. Linda inferior vena cava is normal in size with greater than 50%  respiratory variability, suggesting right atrial pressure of 3 mmHg.   Coronary CTA 01/10/22:  IMPRESSION: 1. Coronary calcium score of 73.7. This was 63rd percentile for age-, sex, and race-matched controls.   2.  Normal coronary origin with right dominance.   3. Mild to moderate atherosclerosis of Linda LAD and RCA. CAD RADS 2.   4.  Recommend preventive therapy and risk factor modification.   5. This study has been submitted for FFR analysis for assessment of Linda proximal RCA.  Laboratory Data:  High Sensitivity Troponin:   Recent Labs  Lab 11/04/22 1328 11/04/22 1620 11/11/22 1503 11/11/22 1801  TROPONINIHS 25* 57* 9 26*      Chemistry Recent Labs  Lab 11/11/22 1801  NA 140  K 4.1  CL 108  CO2 19*  GLUCOSE 93  BUN 19  CREATININE 0.84  CALCIUM 8.9  GFRNONAA >60  ANIONGAP 13    No results for input(s): "PROT", "ALBUMIN", "AST", "ALT", "ALKPHOS", "BILITOT" in Linda last 168 hours. Lipids No results for input(s): "CHOL", "TRIG", "HDL", "LABVLDL", "LDLCALC", "CHOLHDL" in  Linda last 168 hours. Hematology Recent Labs  Lab 11/11/22 1503  WBC 6.0  RBC 3.65*  HGB 12.4  HCT 36.4  MCV 99.7  MCH 34.0  MCHC 34.1  RDW 12.1  PLT 150    Thyroid No results for input(s): "TSH", "FREET4" in Linda last 168 hours. BNPNo results for input(s): "BNP", "PROBNP" in Linda last 168 hours.  DDimer No results for input(s): "DDIMER" in Linda last 168 hours.   Radiology/Studies:  No results found.   Assessment and Plan:    Linda Harvey is a 72 y.o. female with PSVT, HTN, HLD, obesity, and non-obstructive CAD who is being seen 11/13/2022 for Linda evaluation of SVT.  #Adenosine Sensitive PSVT ::Harvey presents with her 3rd episode of SVT within Linda last 2 months. On review of her prior ECGs, I believe that she has evidence of retrograde p waves. Plus her SVT is sensitive to adenosine which suggests a pathway that involves Linda AV node. My greatest suspicion is that Linda Harvey has AVNRT, but other SVTs are certainly still possible. Given that AVNRT is highest on my Ddx, I think treating her with an agent that has preferential effect on Linda AVN makes Linda most sense. Will start diltiazem 30 mg q6h with consolidation to long acting assuming she tolerates it. Will stop coreg and amlodipine. She has an appointment with EP soon who can consider altering her medications +/- EP study with ablation in Linda future. Will observe her overnight to ensure electrical stability. -start diltiazem 30 mg q6h + diltiazem 30 mg po prn -stop coreg and amlodipine -maintain telemetry -Outpatient EP follow up already established for 8/16  #HTN #HLD ::Currently on amlodipine 5 mg daily + lisinopril 20 mg daily for blood pressure. Will stop amlodipine since starting diltiazem. Can increase lisinopril prn if additional BP control is needed off of amlodipine. -stopped amlodipine -continue home lisinopril -continue crestor 20 mg daily -check Lp(a)  #Non-obstructive CAD -continue statin and ASA    Risk Assessment/Risk Scores:          Code Status: Full Code  Severity of Illness: Linda appropriate Harvey status for this Harvey is OBSERVATION. Observation  status is judged to be reasonable and necessary in order to provide Linda required intensity of service to ensure Linda Harvey's safety. Linda Harvey's presenting symptoms, physical exam findings, and initial radiographic and laboratory data in Linda context of their medical condition is felt to place them at decreased risk for further clinical deterioration. Furthermore, it is anticipated that Linda Harvey will be medically stable for discharge from Linda hospital within 2 midnights of admission.    For questions or updates, please contact [facility] Please consult [email or web address] for contact info under     Signed, Karl Ito, MD  11/13/2022 9:48 PM

## 2022-11-13 NOTE — ED Provider Notes (Incomplete)
Patient is a 72 year old female with a history of SVT who unfortunately has been seen here multiple times in the last few weeks and despite being on carvedilol and diltiazem she continues to have frequent episodes of SVT.  Today she had 5 episodes and she is able to vagal out of them but then they just come back.  She called 911 today after her fifth episode because when her heart rate elevates he also gets chest pain that goes down into her arm.  After EMS evaluated they were starting an IV and the SVT resolved.  Currently patient is asymptomatic.  I independently interpreted her EKG which shows sinus rhythm.  Discussed with cardiology given patient's recurrent symptoms and they will come evaluate the patient.  She has recently had lab work which was all benign.

## 2022-11-13 NOTE — ED Notes (Signed)
ED TO INPATIENT HANDOFF REPORT  ED Nurse Name and Phone #: chris [phone]  S Name/Age/Gender Linda Harvey 72 y.o. female Room/Bed: TRAAC/TRAAC  Code Status   Code Status: Full Code  Home/SNF/Other Home Patient oriented to: self, place, time, and situation Is this baseline? Yes   Triage Complete: Triage complete  Chief Complaint SVT (supraventricular tachycardia) [I47.10]  Triage Note The pt arrived by gems from home  hx svt  she was here yesterday and had an episode of  svt was seen here last pm  today her heart started beating very fast today and she converted to a nsr when ems arrived at her house  she iwants an ablation she is tired of going through this and she reports that she feels like that she is not being taken care of   Allergies No Known Allergies  Level of Care/Admitting Diagnosis ED Disposition     ED Disposition  Admit   Condition  --   Comment  Hospital [facility]: [facility] [[id number]]  Level of Care: Progressive [102]  Admit to Progressive based on following criteria: CARDIOVASCULAR & THORACIC of moderate stability with acute coronary syndrome symptoms/low risk myocardial infarction/hypertensive urgency/arrhythmias/heart failure potentially compromising stability and stable post cardiovascular intervention patients.  May place patient in observation at [facility] or [facility] if equivalent level of care is available:: No  Covid Evaluation: Asymptomatic - no recent exposure (last 10 days) testing not required  Diagnosis: SVT (supraventricular tachycardia) [202906]  Admitting Physician: Karl Ito [[id number]]  Attending Physician: Christell Constant [[id number]]          B Medical/Surgery History Past Medical History:  Diagnosis Date   Essential hypertension    Heart murmur    per patient report   Hyperlipidemia    Hypertension    Insomnia    NSTEMI (non-ST elevated myocardial infarction) (HCC) 01/09/2022   Obesity (BMI  35.0-39.9 without comorbidity)    Overactive bladder    PSVT (paroxysmal supraventricular tachycardia) 01/10/2022   Severe Acute Febrile Illness as Child    Past Surgical History:  Procedure Laterality Date   NM MYOVIEW LTD  06/25/14   Low Risk ST - normal perfusion, abnormal GXT, EF ~70%   TRANSTHORACIC ECHOCARDIOGRAM  06/25/14    LV size & function - EF 55-60%, Gr 1 DD, mild-mod TR, Tr MR; Mild Aortic Sclerosis   TUBAL LIGATION Bilateral      A IV Location/Drains/Wounds Patient Lines/Drains/Airways Status     Active Line/Drains/Airways     Name Placement date Placement time Site Days   Peripheral IV 11/13/22 18 G Right Antecubital 11/13/22  1934  Antecubital  less than 1            Intake/Output Last 24 hours No intake or output data in the 24 hours ending 11/13/22 2212  Labs/Imaging No results found for this or any previous visit (from the past 48 hour(s)). No results found.  Pending Labs Unresulted Labs (From admission, onward)     Start     Ordered   11/20/22 0500  Creatinine, serum  (Pharmacologic VTE prophylaxis)  Weekly,   R     Comments: while on enoxaparin therapy    11/13/22 2151   11/13/22 2153  Lipoprotein A (LPA)  Once,   R        11/13/22 2152   11/13/22 2150  CBC with Differential  Once,   STAT        11/13/22 2149   11/13/22 2150  Basic metabolic panel  Once,   STAT        11/13/22 2149   11/13/22 2150  Magnesium  Once,   STAT        11/13/22 2149   11/13/22 2150  TSH  Once,   URGENT        11/13/22 2149            Vitals/Pain Today's Vitals   11/13/22 1930 11/13/22 2000 11/13/22 2045 11/13/22 2130  BP: 131/71 118/76 (!) 166/87 (!) 133/99  Pulse: 64 62 65 (!) 58  Resp: 16 15 19 13   SpO2: 100% 99% 95% 98%  Weight:      Height:      PainSc:        Isolation Precautions No active isolations  Medications Medications  acetaminophen (TYLENOL) tablet 650 mg (has no administration in time range)  ondansetron (ZOFRAN) injection 4 mg  (has no administration in time range)  enoxaparin (LOVENOX) injection 40 mg (has no administration in time range)  diltiazem (CARDIZEM) tablet 30 mg (has no administration in time range)    Mobility walks     Focused Assessments Cardiac Assessment Handoff:  Cardiac Rhythm: Normal sinus rhythm Lab Results  Component Value Date   TROPONINI <0.03 10/29/2015   Lab Results  Component Value Date   DDIMER 1.44 (H) 06/21/2020   Does the Patient currently have chest pain? No    R Recommendations: See Admitting Provider Note  Report given to:   Additional Notes:

## 2022-11-13 NOTE — ED Notes (Signed)
Pt angry upset over the  fact that she was just here yesterdy and she came back so soon still in svt

## 2022-11-14 DIAGNOSIS — I471 Supraventricular tachycardia, unspecified: Principal | ICD-10-CM

## 2022-11-14 MED ORDER — CARVEDILOL 6.25 MG PO TABS: 6.2500 mg | ORAL_TABLET | Freq: Two times a day (BID) | ORAL | 5 refills | Status: DC

## 2022-11-14 NOTE — ED Notes (Signed)
Pt had a small episode of tachycardia that lasted for just a short moment. Pt has been in a NSR/sinus brady for the entire morning. Pt stated she did feel palpitations but denied chest pain/shob.

## 2022-11-14 NOTE — ED Notes (Signed)
Pt was crying while in the bed stating that she is uncomfortable. I offered her some tylenol and she accepted. Pt is complaining of her back hurting from chronic issues.

## 2022-11-14 NOTE — ED Notes (Signed)
ED TO INPATIENT HANDOFF REPORT  ED Nurse Name and Phone #:  Marisue Ivan [phone]  S Name/Age/Gender Linda Harvey 72 y.o. female Room/Bed: 002C/002C  Code Status   Code Status: Full Code  Home/SNF/Other Home Patient oriented to: self, place, and situation Is this baseline? Yes   Triage Complete: Triage complete  Chief Complaint SVT (supraventricular tachycardia) [I47.10]  Triage Note The pt arrived by gems from home  hx svt  she was here yesterday and had an episode of  svt was seen here last pm  today her heart started beating very fast today and she converted to a nsr when ems arrived at her house  she iwants an ablation she is tired of going through this and she reports that she feels like that she is not being taken care of   Allergies No Known Allergies  Level of Care/Admitting Diagnosis ED Disposition     ED Disposition  Admit   Condition  --   Comment  Hospital Area: [facility] [[id number]]  Level of Care: Progressive [102]  Admit to Progressive based on following criteria: CARDIOVASCULAR & THORACIC of moderate stability with acute coronary syndrome symptoms/low risk myocardial infarction/hypertensive urgency/arrhythmias/heart failure potentially compromising stability and stable post cardiovascular intervention patients.  May place patient in observation at [facility] or [facility] if equivalent level of care is available:: No  Covid Evaluation: Asymptomatic - no recent exposure (last 10 days) testing not required  Diagnosis: SVT (supraventricular tachycardia) [202906]  Admitting Physician: Karl Ito [[id number]]  Attending Physician: Christell Constant [[id number]]          B Medical/Surgery History Past Medical History:  Diagnosis Date   Essential hypertension    Heart murmur    per patient report   Hyperlipidemia    Hypertension    Insomnia    NSTEMI (non-ST elevated myocardial infarction) (HCC) 01/09/2022   Obesity (BMI 35.0-39.9  without comorbidity)    Overactive bladder    PSVT (paroxysmal supraventricular tachycardia) 01/10/2022   Severe Acute Febrile Illness as Child    Past Surgical History:  Procedure Laterality Date   NM MYOVIEW LTD  06/25/14   Low Risk ST - normal perfusion, abnormal GXT, EF ~70%   TRANSTHORACIC ECHOCARDIOGRAM  06/25/14    LV size & function - EF 55-60%, Gr 1 DD, mild-mod TR, Tr MR; Mild Aortic Sclerosis   TUBAL LIGATION Bilateral      A IV Location/Drains/Wounds Patient Lines/Drains/Airways Status     Active Line/Drains/Airways     Name Placement date Placement time Site Days   Peripheral IV 11/13/22 18 G Right Antecubital 11/13/22  1934  Antecubital  1            Intake/Output Last 24 hours No intake or output data in the 24 hours ending 11/14/22 1110  Labs/Imaging Results for orders placed or performed during the hospital encounter of 11/13/22 (from the past 48 hour(s))  Basic metabolic panel     Status: Abnormal   Collection Time: 11/13/22 10:39 PM  Result Value Ref Range   Sodium 141 135 - 145 mmol/L   Potassium 3.8 3.5 - 5.1 mmol/L   Chloride 104 98 - 111 mmol/L   CO2 23 22 - 32 mmol/L   Glucose, Bld 98 70 - 99 mg/dL    Comment: Glucose reference range applies only to samples taken after fasting for at least 8 hours.   BUN 19 8 - 23 mg/dL   Creatinine, Ser 7.82 (H) 0.44 - 1.00  mg/dL   Calcium 9.1 8.9 - 95.2 mg/dL   GFR, Estimated 57 (L) >60 mL/min    Comment: (NOTE) Calculated using the CKD-EPI Creatinine Equation (2021)    Anion gap 14 5 - 15    Comment: Performed at [facility], [address] [place], [place] [address]  Magnesium     Status: None   Collection Time: 11/13/22 10:39 PM  Result Value Ref Range   Magnesium 2.1 1.7 - 2.4 mg/dL    Comment: Performed at [facility], [address] [place], [place] [address]  TSH     Status: None   Collection Time: 11/13/22 10:39 PM  Result Value Ref Range   TSH 2.199 0.350 - 4.500 uIU/mL     Comment: Performed by a 3rd Generation assay with a functional sensitivity of <=0.01 uIU/mL. Performed at [facility], [address] [place], [place] [address]   CBC with Differential/Platelet     Status: None   Collection Time: 11/13/22 10:47 PM  Result Value Ref Range   WBC 5.2 4.0 - 10.5 K/uL   RBC 3.92 3.87 - 5.11 MIL/uL   Hemoglobin 12.8 12.0 - 15.0 g/dL   HCT 66.4 40.3 - 47.4 %   MCV 98.7 80.0 - 100.0 fL   MCH 32.7 26.0 - 34.0 pg   MCHC 33.1 30.0 - 36.0 g/dL   RDW 25.9 56.3 - 87.5 %   Platelets 242 150 - 400 K/uL   nRBC 0.0 0.0 - 0.2 %   Neutrophils Relative % 52 %   Neutro Abs 2.7 1.7 - 7.7 K/uL   Lymphocytes Relative 36 %   Lymphs Abs 1.9 0.7 - 4.0 K/uL   Monocytes Relative 9 %   Monocytes Absolute 0.5 0.1 - 1.0 K/uL   Eosinophils Relative 2 %   Eosinophils Absolute 0.1 0.0 - 0.5 K/uL   Basophils Relative 1 %   Basophils Absolute 0.0 0.0 - 0.1 K/uL   Immature Granulocytes 0 %   Abs Immature Granulocytes 0.02 0.00 - 0.07 K/uL    Comment: Performed at [facility], [address], [place], [place] [address]   No results found.  Pending Labs Unresulted Labs (From admission, onward)     Start     Ordered   11/20/22 0500  Creatinine, serum  (Pharmacologic VTE prophylaxis)  Weekly,   R     Comments: while on enoxaparin therapy    11/13/22 2151   11/13/22 2153  Lipoprotein A (LPA)  Once,   R        11/13/22 2152   11/13/22 2150  CBC with Differential  Once,   STAT        11/13/22 2149            Vitals/Pain Today's Vitals   11/14/22 0806 11/14/22 0830 11/14/22 0909 11/14/22 0921  BP: 128/69 125/60 (!) 113/51   Pulse:  69    Resp:  20    Temp:      TempSrc:      SpO2:  97%    Weight:      Height:      PainSc:    0-No pain    Isolation Precautions No active isolations  Medications Medications  acetaminophen (TYLENOL) tablet 650 mg (has no administration in time range)  ondansetron (ZOFRAN) injection 4 mg (has no administration in  time range)  enoxaparin (LOVENOX) injection 40 mg (40 mg Subcutaneous Given 11/13/22 2226)  diltiazem (CARDIZEM) tablet  30 mg (30 mg Oral Given 11/14/22 0806)  aspirin EC tablet 81 mg (81 mg Oral Given 11/14/22 0909)  lisinopril (ZESTRIL) tablet 20 mg (20 mg Oral Given 11/14/22 0909)  rosuvastatin (CRESTOR) tablet 20 mg (20 mg Oral Given 11/13/22 2315)  buPROPion (WELLBUTRIN SR) 12 hr tablet 150 mg (150 mg Oral Given 11/14/22 0909)  calcium carbonate (TUMS - dosed in mg elemental calcium) chewable tablet 200-600 mg of elemental calcium (has no administration in time range)  oxybutynin (DITROPAN-XL) 24 hr tablet 30 mg (30 mg Oral Given 11/13/22 2319)  multivitamin with minerals tablet 1 tablet (1 tablet Oral Given 11/14/22 0806)    Mobility walks     Focused Assessments Cardiac Assessment Handoff:  Cardiac Rhythm: Normal sinus rhythm Lab Results  Component Value Date   TROPONINI <0.03 10/29/2015   Lab Results  Component Value Date   DDIMER 1.44 (H) 06/21/2020   Does the Patient currently have chest pain? No    R Recommendations: See Admitting Provider Note  Report given to:   Additional Notes:  Had a very short moment of tachycardia at 120-140. Back into a SR. Has been in a SR all morning for me. Felt palpitations but no cp/shob \

## 2022-11-14 NOTE — ED Notes (Signed)
Pt is a&ox4, pwd. Pt denies any pain but complains of feeling uncomfortable due to being attached to monitor and vitals. Pt is attached to monitor and vitals, side rails up x 2, call light within reach. Pt requested that the door be left open due to feeling claustrophobic.

## 2022-11-14 NOTE — Discharge Summary (Signed)
Discharge Summary    Patient ID: Linda Harvey MRN: [id number]; DOB: 04/22/1950  Admit date: 11/13/2022 Discharge date: 11/14/2022  PCP:  Merri Brunette, MD   [facility] Providers Cardiologist:  Bryan Lemma, MD        Discharge Diagnoses    Principal Problem:   SVT (supraventricular tachycardia)   Diagnostic Studies/Procedures    None performed this admission.    History of Present Illness     Linda Harvey is a 72 y.o. female with past medical history of CAD (mild to moderate non-obstructive CAD noted on Coronary CTA in 01/2022), HTN, HLD, MR, obesity and SVT who presented to [facility] on 11/12/2022 for evaluation of palpitations.  She has experienced multiple ED evaluations for this over the past several months and was evaluated in the office on 11/09/2022 and reported still having frequent palpitations. She did report consuming alcohol at night and questioned if this was contributing to her episodes. She previously did not tolerate Toprol-XL and was continued on Coreg 3.125 mg twice daily and provided with an Rx for PRN short-acting Cardizem 30 mg. She was referred to EP for consideration of ablation.  She presented to the ED on 11/11/2022 for evaluation of dizziness and chest pain and was found to be in SVT with heart rate in the 160's to 180's and converted to normal sinus rhythm with 6 mg of Adenosine. She had recurrent SVT and was given 12 mg of Adenosine with conversion back to normal sinus rhythm. She was discharged home but presented back on 11/13/2022 for recurrent SVT. She reported having 5 episodes throughout the day and was usually able to break the episodes with vagal maneuvers but would have recurrences.  She was evaluated by the cardiology fellow and reported utilizing multiple vagal maneuvers including bearing down, blowing through a straw and submerging her face in water and her heart rate would improve but she would have recurrent tachycardia. Labs were  reassuring showing WBC 5.2, Hgb 12.8, platelets 242, Na+ 141, K+ 3.8 and creatinine 1.04.  Magnesium 2.1 and TSH 2.199.  She was admitted overnight for observation.  Hospital Course     Consultants: None  She was evaluated by Dr. Nelly Laurence the following morning and reported improvement in her symptoms. 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 It was recommended to increase Coreg to 6.25 mg twice daily and continue short-acting Cardizem 30 mg as needed. Given that she was only using this as needed, she was continued on Amlodipine along with PTA Lisinopril. She was deemed stable for discharge and encouraged to keep scheduled EP follow-up for 11/19/2022.  _____________  Discharge Vitals Blood pressure 120/69, pulse 62, temperature 98.4 F (36.9 C), temperature source Oral, resp. rate 14, height 5\' 4"  (1.626 m), weight 80.7 kg, SpO2 99%.  Filed Weights   11/13/22 1902  Weight: 80.7 kg    Labs & Radiologic Studies    CBC Recent Labs    11/11/22 1503 11/13/22 2247  WBC 6.0 5.2  NEUTROABS  --  2.7  HGB 12.4 12.8  HCT 36.4 38.7  MCV 99.7 98.7  PLT 150 242   Basic Metabolic Panel Recent Labs    29/52/84 1801 11/13/22 2239  NA 140 141  K 4.1 3.8  CL 108 104  CO2 19* 23  GLUCOSE 93 98  BUN 19 19  CREATININE 0.84 1.04*  CALCIUM 8.9 9.1  MG  --  2.1   Liver Function Tests No  results for input(s): "AST", "ALT", "ALKPHOS", "BILITOT", "PROT", "ALBUMIN" in the last 72 hours. No results for input(s): "LIPASE", "AMYLASE" in the last 72 hours. High Sensitivity Troponin:   Recent Labs  Lab 11/04/22 1328 11/04/22 1620 11/11/22 1503 11/11/22 1801  TROPONINIHS 25* 57* 9 26*    BNP Invalid input(s): "POCBNP" D-Dimer No results for input(s): "DDIMER" in the last 72 hours. Hemoglobin A1C No results for input(s): "HGBA1C" in the last 72 hours. Fasting Lipid Panel No results for input(s): "CHOL", "HDL", "LDLCALC", "TRIG", "CHOLHDL", "LDLDIRECT" in  the last 72 hours. Thyroid Function Tests Recent Labs    11/13/22 2239  TSH 2.199   _____________  DG Chest Port 1 View  Result Date: 11/11/2022 CLINICAL DATA:  Supraventricular tachycardia. EXAM: PORTABLE CHEST 1 VIEW COMPARISON:  Radiographs 09/13/2022, 01/09/2022 and 06/12/2020. Cardiac CT 01/10/2022. FINDINGS: 1508 hours. The heart size and mediastinal contours are stable with mild aortic atherosclerosis. The lungs are main clear. No pleural effusion or pneumothorax. No acute osseous findings are evident. Telemetry leads overlie the chest. IMPRESSION: No active cardiopulmonary process. Aortic atherosclerosis. Electronically Signed   By: Carey Bullocks M.D.   On: 11/11/2022 15:15   Disposition   Pt is being discharged home today in good condition.  Follow-up Plans & Appointments     Follow-up Information     Regan Lemming, MD Follow up on 11/19/2022.   Specialty: Cardiology Why: Keep previously scheduled follow-up with Electrophysiology on 11/19/2022 at 3:30 PM. Contact information: [address] [place] [address] [phone]                Discharge Instructions     Diet - low sodium heart healthy   Complete by: As directed         Discharge Medications   Allergies as of 11/14/2022   No Known Allergies      Medication List     TAKE these medications    acetaminophen 500 MG tablet Commonly known as: TYLENOL Take 1,000 mg by mouth as needed for headache or moderate pain.   amLODipine 5 MG tablet Commonly known as: NORVASC Take 5 mg by mouth daily.   aspirin EC 81 MG tablet Take 81 mg by mouth daily.   buPROPion 150 MG 12 hr tablet Commonly known as: WELLBUTRIN SR Take 150 mg by mouth daily.   carvedilol 6.25 MG tablet Commonly known as: COREG Take 1 tablet (6.25 mg total) by mouth 2 (two) times daily with a meal. What changed:  medication strength how much to take   Centrum Silver 50+Women Tabs Take 1 tablet by  mouth daily with breakfast.   diltiazem 30 MG tablet Commonly known as: Cardizem Take as needed for sustained heart racing/palpitations lasting 5 to 10 minutes OR up to 2 times daily. What changed:  how much to take how to take this when to take this reasons to take this additional instructions   FISH OIL PO Take 1 capsule by mouth daily.   lisinopril 20 MG tablet Commonly known as: ZESTRIL Take 20 mg by mouth daily.   LORazepam 1 MG tablet Commonly known as: Ativan Take 1 tablet (1 mg total) by mouth 2 (two) times daily as needed for anxiety.   Nitrostat 0.4 MG SL tablet Generic drug: nitroGLYCERIN Place 0.4 mg under the tongue every 5 (five) minutes as needed for chest pain.   OVER THE COUNTER MEDICATION Apply 1 Application topically at bedtime. To legs for restless legs. CBD Oil  oxybutynin 15 MG 24 hr tablet Commonly known as: DITROPAN XL Take 30 mg by mouth at bedtime.   rosuvastatin 20 MG tablet Commonly known as: CRESTOR Take 20 mg by mouth at bedtime.   Tums 500 MG chewable tablet Generic drug: calcium carbonate Chew 1-3 tablets by mouth daily as needed for indigestion or heartburn.   Zaditor 0.025 % ophthalmic solution Generic drug: ketotifen Place 1 drop into both eyes 2 (two) times daily as needed (for itching/dry eye).         Outstanding Labs/Studies   None  Duration of Discharge Encounter   Greater than 30 minutes including physician time.  Signed, Ellsworth Lennox, PA-C 11/14/2022, 12:51 PM

## 2022-11-14 NOTE — Progress Notes (Signed)
Progress Note  Patient Name: Linda Harvey Date of Encounter: 11/14/2022  Primary Cardiologist: Bryan Lemma, MD   Subjective   Feeling better now. Vagal maneuvers have worked to break the tachycardia, but it frequently recurs almost immediately.  Inpatient Medications    Scheduled Meds:  aspirin EC  81 mg Oral Daily   buPROPion  150 mg Oral Daily   diltiazem  30 mg Oral QID   enoxaparin (LOVENOX) injection  40 mg Subcutaneous Q24H   lisinopril  20 mg Oral Daily   multivitamin with minerals  1 tablet Oral Q breakfast   oxybutynin  30 mg Oral QHS   rosuvastatin  20 mg Oral QHS   Continuous Infusions:  PRN Meds: acetaminophen, calcium carbonate, ondansetron (ZOFRAN) IV   Vital Signs    Vitals:   11/14/22 0735 11/14/22 0806 11/14/22 0830 11/14/22 0909  BP:  128/69 125/60 (!) 113/51  Pulse:   69   Resp:   20   Temp: (!) 97.5 F (36.4 C)     TempSrc: Oral     SpO2:   97%   Weight:      Height:       No intake or output data in the 24 hours ending 11/14/22 1044 Filed Weights   11/13/22 1902  Weight: 80.7 kg    Telemetry    Sinus rhythm - Personally Reviewed  ECG    Sinus rhythm; prior ECG shows short R-P tach - Personally Reviewed  Physical Exam   GEN: No acute distress.   Neck: No JVD Cardiac: RRR, no murmurs, rubs, or gallops.  Respiratory: Clear to auscultation bilaterally. GI: Soft, nontender, non-distended  MS: No edema; No deformity. Neuro:  Nonfocal  Psych: Normal affect   Labs    Chemistry Recent Labs  Lab 11/11/22 1801 11/13/22 2239  NA 140 141  K 4.1 3.8  CL 108 104  CO2 19* 23  GLUCOSE 93 98  BUN 19 19  CREATININE 0.84 1.04*  CALCIUM 8.9 9.1  GFRNONAA >60 57*  ANIONGAP 13 14     Hematology Recent Labs  Lab 11/11/22 1503 11/13/22 2247  WBC 6.0 5.2  RBC 3.65* 3.92  HGB 12.4 12.8  HCT 36.4 38.7  MCV 99.7 98.7  MCH 34.0 32.7  MCHC 34.1 33.1  RDW 12.1 12.1  PLT 150 242    Cardiac EnzymesNo results for  input(s): "TROPONINI" in the last 168 hours. No results for input(s): "TROPIPOC" in the last 168 hours.   BNPNo results for input(s): "BNP", "PROBNP" in the last 168 hours.   DDimer No results for input(s): "DDIMER" in the last 168 hours.   Summary of Pertinent studies    TTE: 01/11/2023 EF 60-65%. Normal structure and function  Cardiac cath:  Imaging:  Labs: reviewed  Patient Profile     72 y.o. female with PSVT, HTN, HLD, obesity, and non-obstructive CAD who is being seen 11/13/2022 for the evaluation of tachycardia.  She presents for her third or fourth ER visit for SVT in the past year.  Metoprolol was tried in the past but she did not tolerate it.  Assessment & Plan    SVT Recurrent short RP tachycardia  responsive to adenosine and vagal maneuvers No preexcitation on ECG, PR is normal Suspect AVNRT > AVRT Increase carvedilol to 6.25, continue dilt 30 PRN Mild non-obstructive CAD; will exhaust AV node blocking medications prior to considering Class I AAD. Avoid amiodarone which will interfere with EP study and definitive treatment Has appointment  with Dr Elberta Fortis this week to schedule EP study/ablation  HTN Continue amlodipine and lisinopril  Non-obstructive CAD Continue ASA, statin   For questions or updates, please contact [facility] Please consult [email or web address] for contact info under Cardiology/STEMI.      Signed, Maurice Small, MD 11/14/2022, 10:44 AM

## 2022-11-15 DIAGNOSIS — M9903 Segmental and somatic dysfunction of lumbar region: Secondary | ICD-10-CM | POA: Diagnosis not present

## 2022-11-15 DIAGNOSIS — M25551 Pain in right hip: Secondary | ICD-10-CM | POA: Diagnosis not present

## 2022-11-15 DIAGNOSIS — M9905 Segmental and somatic dysfunction of pelvic region: Secondary | ICD-10-CM | POA: Diagnosis not present

## 2022-11-15 DIAGNOSIS — M5116 Intervertebral disc disorders with radiculopathy, lumbar region: Secondary | ICD-10-CM | POA: Diagnosis not present

## 2022-11-15 LAB — LIPOPROTEIN A (LPA): Lipoprotein (a): 30.5 nmol/L — ABNORMAL HIGH (ref <75.0)

## 2022-11-16 ENCOUNTER — Other Ambulatory Visit: Payer: Self-pay

## 2022-11-16 ENCOUNTER — Emergency Department (HOSPITAL_COMMUNITY)
Admission: EM | Admit: 2022-11-16 | Discharge: 2022-11-17 | Disposition: A | Discharge status: Home / Self Care | Payer: Medicare Other | Attending: Emergency Medicine | Admitting: Emergency Medicine

## 2022-11-16 ENCOUNTER — Encounter (HOSPITAL_COMMUNITY): Payer: Self-pay | Admitting: Emergency Medicine

## 2022-11-16 DIAGNOSIS — M9903 Segmental and somatic dysfunction of lumbar region: Secondary | ICD-10-CM | POA: Diagnosis not present

## 2022-11-16 DIAGNOSIS — M5116 Intervertebral disc disorders with radiculopathy, lumbar region: Secondary | ICD-10-CM | POA: Diagnosis not present

## 2022-11-16 DIAGNOSIS — Z7982 Long term (current) use of aspirin: Secondary | ICD-10-CM | POA: Insufficient documentation

## 2022-11-16 DIAGNOSIS — M9905 Segmental and somatic dysfunction of pelvic region: Secondary | ICD-10-CM | POA: Diagnosis not present

## 2022-11-16 DIAGNOSIS — I4719 Other supraventricular tachycardia: Secondary | ICD-10-CM | POA: Insufficient documentation

## 2022-11-16 DIAGNOSIS — Z79899 Other long term (current) drug therapy: Secondary | ICD-10-CM | POA: Insufficient documentation

## 2022-11-16 DIAGNOSIS — I471 Supraventricular tachycardia, unspecified: Secondary | ICD-10-CM

## 2022-11-16 DIAGNOSIS — R002 Palpitations: Secondary | ICD-10-CM | POA: Diagnosis not present

## 2022-11-16 DIAGNOSIS — R231 Pallor: Secondary | ICD-10-CM | POA: Diagnosis not present

## 2022-11-16 DIAGNOSIS — R000 Tachycardia, unspecified: Secondary | ICD-10-CM | POA: Diagnosis not present

## 2022-11-16 DIAGNOSIS — M25551 Pain in right hip: Secondary | ICD-10-CM | POA: Diagnosis not present

## 2022-11-16 DIAGNOSIS — I959 Hypotension, unspecified: Secondary | ICD-10-CM | POA: Diagnosis not present

## 2022-11-16 NOTE — ED Provider Notes (Signed)
[place] [facility] DEPARTMENT AT [facility] Provider Note   CSN: [id number] Arrival date & time: 11/16/22  2149     History  Chief Complaint  Patient presents with   Palpitations    Linda Harvey is a 72 y.o. female.  Pt with hx svt, presents w recurrent episode svt this PM at rest. EMS gave adenosine with conversion to nsr. Pt indicates compliant w meds. Recent admission to cardiology w same, and recent med adjustment. Pt w plans to f/u as outpatient for possible ablation. Denies any exertional or preceding episodes of chest pain or discomfort. No sob or unusual doe. Recent thyroid tests normal. Denies recent etoh use.   The history is provided by the patient, medical records and the EMS personnel.  Chest Pain Associated symptoms: palpitations   Associated symptoms: no abdominal pain, no back pain, no cough, no fever, no headache, no shortness of breath and no vomiting        Home Medications Prior to Admission medications   Medication Sig Start Date End Date Taking? Authorizing Provider  acetaminophen (TYLENOL) 500 MG tablet Take 1,000 mg by mouth as needed for headache or moderate pain.    [provider]  amLODipine (NORVASC) 5 MG tablet Take 5 mg by mouth daily. 11/05/21   [provider]  aspirin EC 81 MG tablet Take 81 mg by mouth daily.    [provider]  buPROPion (WELLBUTRIN SR) 150 MG 12 hr tablet Take 150 mg by mouth daily.    [provider]  carvedilol (COREG) 6.25 MG tablet Take 1 tablet (6.25 mg total) by mouth 2 (two) times daily with a meal. 11/14/22   Strader, [place] M, PA-C  diltiazem (CARDIZEM) 30 MG tablet Take as needed for sustained heart racing/palpitations lasting 5 to 10 minutes OR up to 2 times daily. Patient taking differently: Take 30-60 mg by mouth as needed (high heart rate). 11/09/22   [name] E, PA-C  lisinopril (ZESTRIL) 20 MG tablet Take 20 mg by mouth daily. 12/09/21   [provider]  LORazepam (ATIVAN) 1 MG tablet Take 1 tablet (1 mg total) by mouth 2 (two) times daily as needed for anxiety. 11/11/22   Jacalyn Lefevre, MD  Multiple Vitamins-Minerals (CENTRUM SILVER 50+WOMEN) TABS Take 1 tablet by mouth daily with breakfast.    [provider]  NITROSTAT 0.4 MG SL tablet Place 0.4 mg under the tongue every 5 (five) minutes as needed for chest pain. Patient not taking: Reported on 11/14/2022 06/11/14   [provider]  Omega-3 Fatty Acids (FISH OIL PO) Take 1 capsule by mouth daily.    [provider]  OVER THE COUNTER MEDICATION Apply 1 Application topically at bedtime. To legs for restless legs. CBD Oil    [provider]  oxybutynin (DITROPAN XL) 15 MG 24 hr tablet Take 30 mg by mouth at bedtime. 10/10/21   [provider]  rosuvastatin (CRESTOR) 20 MG tablet Take 20 mg by mouth at bedtime. 09/28/21   [provider]  TUMS 500 MG chewable tablet Chew 1-3 tablets by mouth daily as needed for indigestion or heartburn.    [provider]  ZADITOR 0.025 % ophthalmic solution Place 1 drop into both eyes 2 (two) times daily as needed (for itching/dry eye).    [provider]      Allergies    Patient has no known allergies.    Review of Systems   Review of Systems  Constitutional:  Negative for chills and fever.  Eyes:  Negative for pain.  Respiratory:  Negative for cough and shortness of breath.   Cardiovascular:  Positive for palpitations. Negative for chest pain and leg swelling.  Gastrointestinal:  Negative for abdominal pain and vomiting.  Musculoskeletal:  Negative for back pain and neck pain.  Skin:  Negative for rash.  Neurological:  Negative for syncope and headaches.    Physical Exam Updated Vital Signs BP 135/85 (BP Location: Left Arm)   Pulse 64   Temp (!) 97.5 F (36.4 C) (Oral)   Resp 16   Ht 1.626 m (5\' 4" )   Wt 80.7 kg   SpO2 98%   BMI 30.54 kg/m  Physical  Exam Vitals and nursing note reviewed.  Constitutional:      Appearance: Normal appearance. She is well-developed.  HENT:     Head: Atraumatic.     Nose: Nose normal.     Mouth/Throat:     Mouth: Mucous membranes are moist.  Eyes:     General: No scleral icterus.    Conjunctiva/sclera: Conjunctivae normal.  Neck:     Trachea: No tracheal deviation.     Comments: Thyroid not grossly enlarged or tender. Trachea midline.  Cardiovascular:     Rate and Rhythm: Normal rate and regular rhythm.     Pulses: Normal pulses.     Heart sounds: Normal heart sounds. No murmur heard.    No friction rub. No gallop.  Pulmonary:     Effort: Pulmonary effort is normal. No respiratory distress.     Breath sounds: Normal breath sounds.  Abdominal:     General: There is no distension.     Palpations: Abdomen is soft.     Tenderness: There is no abdominal tenderness.  Genitourinary:    Comments: No cva tenderness.  Musculoskeletal:        General: No swelling or tenderness.     Cervical back: Normal range of motion and neck supple. No rigidity. No muscular tenderness.  Skin:    General: Skin is warm and dry.     Findings: No rash.  Neurological:     Mental Status: She is alert.     Comments: Alert, speech normal.   Psychiatric:        Mood and Affect: Mood normal.     ED Results / Procedures / Treatments   Labs (all labs ordered are listed, but only abnormal results are displayed) Results for orders placed or performed during the hospital encounter of 11/13/22  Basic metabolic panel  Result Value Ref Range   Sodium 141 135 - 145 mmol/L   Potassium 3.8 3.5 - 5.1 mmol/L   Chloride 104 98 - 111 mmol/L   CO2 23 22 - 32 mmol/L   Glucose, Bld 98 70 - 99 mg/dL   BUN 19 8 - 23 mg/dL   Creatinine, Ser 4.13 (H) 0.44 - 1.00 mg/dL   Calcium 9.1 8.9 - 24.4 mg/dL   GFR, Estimated 57 (L) >60 mL/min   Anion gap 14 5 - 15  Magnesium  Result Value Ref Range   Magnesium 2.1 1.7 - 2.4 mg/dL   Lipoprotein A (LPA)  Result Value Ref Range   Lipoprotein (a) 30.5 (H) <75.0 nmol/L  CBC with Differential/Platelet  Result Value Ref Range   WBC 5.2 4.0 - 10.5 K/uL   RBC 3.92 3.87 - 5.11 MIL/uL   Hemoglobin 12.8 12.0 - 15.0 g/dL   HCT 01.0 27.2 - 53.6 %  MCV 98.7 80.0 - 100.0 fL   MCH 32.7 26.0 - 34.0 pg   MCHC 33.1 30.0 - 36.0 g/dL   RDW 53.6 64.4 - 03.4 %   Platelets 242 150 - 400 K/uL   nRBC 0.0 0.0 - 0.2 %   Neutrophils Relative % 52 %   Neutro Abs 2.7 1.7 - 7.7 K/uL   Lymphocytes Relative 36 %   Lymphs Abs 1.9 0.7 - 4.0 K/uL   Monocytes Relative 9 %   Monocytes Absolute 0.5 0.1 - 1.0 K/uL   Eosinophils Relative 2 %   Eosinophils Absolute 0.1 0.0 - 0.5 K/uL   Basophils Relative 1 %   Basophils Absolute 0.0 0.0 - 0.1 K/uL   Immature Granulocytes 0 %   Abs Immature Granulocytes 0.02 0.00 - 0.07 K/uL  TSH  Result Value Ref Range   TSH 2.199 0.350 - 4.500 uIU/mL   DG Chest Port 1 View  Result Date: 11/11/2022 CLINICAL DATA:  Supraventricular tachycardia. EXAM: PORTABLE CHEST 1 VIEW COMPARISON:  Radiographs 09/13/2022, 01/09/2022 and 06/12/2020. Cardiac CT 01/10/2022. FINDINGS: 1508 hours. The heart size and mediastinal contours are stable with mild aortic atherosclerosis. The lungs are main clear. No pleural effusion or pneumothorax. No acute osseous findings are evident. Telemetry leads overlie the chest. IMPRESSION: No active cardiopulmonary process. Aortic atherosclerosis. Electronically Signed   By: Carey Bullocks M.D.   On: 11/11/2022 15:15     EKG EKG Interpretation Date/Time:  Tuesday November 16 2022 21:59:05 EDT Ventricular Rate:  64 PR Interval:  146 QRS Duration:  101 QT Interval:  403 QTC Calculation: 416 R Axis:   -26  Text Interpretation: Sinus rhythm No significant change since last tracing Confirmed by Cathren Laine ([id number]) on 11/16/2022 10:08:14 PM  Radiology No results found.  Procedures Procedures    Medications Ordered in ED Medications -  No data to display  ED Course/ Medical Decision Making/ A&P                                 Medical Decision Making Problems Addressed: Paroxysmal SVT (supraventricular tachycardia): acute illness or injury with systemic symptoms that poses a threat to life or bodily functions  Amount and/or Complexity of Data Reviewed Independent Historian: EMS    Details: hx External Data Reviewed: labs and notes. Labs:  Decision-making details documented in ED Course. Radiology: independent interpretation performed. Decision-making details documented in ED Course. ECG/medicine tests: ordered and independent interpretation performed. Decision-making details documented in ED Course.  Risk Decision regarding hospitalization.   Iv ns. Continuous pulse ox and cardiac monitoring.   Differential diagnosis includes svt, afib, etc. Dispo decision including potential need for admission considered - will monitor, check recent labs and reassess.    Reviewed nursing notes and prior charts for additional history. External reports reviewed. EMS rhythm strips with svt/narrow complex. Additional history from: EMS.  Cardiac monitor: sinus rhythm, rate 68.  Recent labs reviewed/interpreted by me - chem normal. Tsh norrmal.   Recent chest xrays reviewed/interpreted by me - no pna.   Pt indicates has adequate of her meds at home.   Remains in nsr, no cp or sob.   Pt indicates did already have her evening meds/rate control meds. Pt remains in sinus rhythm for ~ 2 hours, rate 60, is asymptomatic.   Pt appears stable for d/c. Pt already has EP f/u in the next couple days.   Rec close cardiology  f/u.  Return precautions provided.          Final Clinical Impression(s) / ED Diagnoses Final diagnoses:  None    Rx / DC Orders ED Discharge Orders     None         Cathren Laine, MD 11/16/22 2339

## 2022-11-16 NOTE — ED Triage Notes (Signed)
Pt from home for SVT. EMS gave 6mg  adenosine for SVT of 180 and converted. Pt remained in NSR for 20 mins and then converted to SVT again. Given 12mg  adenosine and has now remained in NSR for 45 mins.

## 2022-11-16 NOTE — Discharge Instructions (Signed)
It was our pleasure to provide your ER care today - we hope that you feel better.  Follow up with the cariology EP doctor as arranged this week.   Continue meds, and take the cardizem as need if palpitations.  Return to ER if worse, new symptoms, fevers, persistent fast heart beating, chest pain, increased trouble breathing, fainting, or other concern.

## 2022-11-17 ENCOUNTER — Telehealth: Payer: Self-pay | Admitting: Cardiology

## 2022-11-17 NOTE — Telephone Encounter (Signed)
Patient c/o Palpitations:  High priority if patient c/o lightheadedness, shortness of breath, or chest pain  How long have you had palpitations/irregular HR/ Afib? Are you having the symptoms now? For a while, been an on going thing. Yes, having palpitations now  Are you currently experiencing lightheadedness, SOB or CP? Yes to all  Do you have a history of afib (atrial fibrillation) or irregular heart rhythm? Yes   Have you checked your BP or HR? (document readings if available): BP 136/72 HR 77  Are you experiencing any other symptoms? Dizziness, left arm hurting   Pt states she's been going to the hospital 3-4x a week. Last time was last night til 12am. Says her HR was 175. She says that right now she is real dizzy while sitting, having palpitations, with her arm hurting. She states the hospital doesn't do anything, just monitors her because "they aren't a cardiologist". She is scared she'll go into another episdoe today. She states she has already taken the medication given for palpitations, but doesn't seem to help. She says she can't wait til tomorrow for her consult with [facility] because she is afraid she'll have another episode. Pt's husband was advised Dr. Elberta Fortis is not here today. She feels like she is going crazy. She says it is just a cycle of going to the hospital, being sent home with no help. She states yesterday she wasn't doing anything but sitting and still went into an episode. She says she has done all the recommended things to help with the episodes but to no avail. Pt's husband reiterated to pt that Dr. Elberta Fortis is not here today, pt states she guesses she'll just die today then. Call transferred to triage.

## 2022-11-17 NOTE — Telephone Encounter (Signed)
Pt husband Kathlene November called in. BP 143/70 HR 50 in the right arm and left arm is 176/78 and HR is 50 Pt is having chest pain and the pain is on the left side of chest and going down left arm. Pt is having SOB, lightheaded and very dizzy with no headache. She is having heart palpatations and no nausea today but did have it yesterday. No swelling in arms or legs. She has severe anxiety and she did take it but it is not helping.   Per Dr. Flora Lipps pt is to take and extra Carvedilol 12.5mg  and go to her appt tomorrow with [facility]. Pt and husband verbalized understanding. Pt takes Ativan for anxiety and this nurse suggested she take another later today as it is prn. Pt took her Wellbutrin while this nurse was on the phone. Pt takes Cardezim as needed as well for her palpatations. This nurse advised pt and husband to take as prescribed. Both verbalized understanding.   Advised pt to find a quiet spot, relax, and keep her mind focused on something pleasant. Pt verbalized understanding.

## 2022-11-18 ENCOUNTER — Ambulatory Visit: Payer: Medicare Other | Attending: Cardiology | Admitting: Cardiology

## 2022-11-18 ENCOUNTER — Encounter: Payer: Self-pay | Admitting: Cardiology

## 2022-11-18 VITALS — BP 126/82 | HR 60 | Ht 64.0 in | Wt 178.4 lb

## 2022-11-18 DIAGNOSIS — M25551 Pain in right hip: Secondary | ICD-10-CM | POA: Diagnosis not present

## 2022-11-18 DIAGNOSIS — M9903 Segmental and somatic dysfunction of lumbar region: Secondary | ICD-10-CM | POA: Diagnosis not present

## 2022-11-18 DIAGNOSIS — I471 Supraventricular tachycardia, unspecified: Secondary | ICD-10-CM | POA: Diagnosis not present

## 2022-11-18 DIAGNOSIS — M9905 Segmental and somatic dysfunction of pelvic region: Secondary | ICD-10-CM | POA: Diagnosis not present

## 2022-11-18 DIAGNOSIS — M5116 Intervertebral disc disorders with radiculopathy, lumbar region: Secondary | ICD-10-CM | POA: Diagnosis not present

## 2022-11-18 DIAGNOSIS — I10 Essential (primary) hypertension: Secondary | ICD-10-CM | POA: Diagnosis not present

## 2022-11-18 MED ORDER — FLECAINIDE ACETATE 50 MG PO TABS: 50.0000 mg | ORAL_TABLET | Freq: Two times a day (BID) | ORAL | 3 refills | Status: DC

## 2022-11-18 NOTE — Progress Notes (Signed)
  Electrophysiology Office Note:   Date:  11/18/2022  ID:  Linda Harvey, DOB [birth year], MRN [id number]  Primary Cardiologist: Bryan Lemma, MD Electrophysiologist: None      History of Present Illness:   Linda Harvey is a 72 y.o. female with h/o SVT, mitral regurgitation, hypertension, hyperlipidemia, obesity, mild coronary artery disease seen today for  for Electrophysiology evaluation of SVT at the request of Linda Harvey.      She was admitted to the hospital October 2023 with SVT.  She presented with chest tightness.  She was given adenosine which terminated the SVT back to sinus rhythm.  She had a normal echo.  Coronary CTA showed a calcium score of 74.  She presented to the emergency room multiple times with episodes of SVT.  Most recently, she presented with heart rates in the 160s to 170s.  She tried vagal maneuvers but this did not terminate tachycardia.  SVT resolved prior to arrival to the emergency room.  She does notice that alcohol affects her SVT.      She continues to feel poorly with significant palpitations.  She feels short of breath with some left arm discomfort when she is in SVT.  She has been taking Ativan which has improved her anxiety.  Review of systems complete and found to be negative unless listed in HPI.   EP Information / Studies Reviewed:    EKG is not ordered today. EKG from 11/16/22 reviewed which showed sinus rhythm        Risk Assessment/Calculations:              Physical Exam:   VS:  BP 126/82   Pulse 60   Ht 5\' 4"  (1.626 m)   Wt 178 lb 6.4 oz (80.9 kg)   SpO2 97%   BMI 30.62 kg/m    Wt Readings from Last 3 Encounters:  11/18/22 178 lb 6.4 oz (80.9 kg)  11/16/22 177 lb 14.6 oz (80.7 kg)  11/13/22 177 lb 14.6 oz (80.7 kg)     GEN: Well nourished, well developed in no acute distress NECK: No JVD; No carotid bruits CARDIAC: Regular rate and rhythm, no murmurs, rubs, gallops RESPIRATORY:  Clear to auscultation without rales,  wheezing or rhonchi  ABDOMEN: Soft, non-tender, non-distended EXTREMITIES:  No edema; No deformity   ASSESSMENT AND PLAN:    1.  SVT: Short RP tachycardia based on EKG 01/09/2022.  She is currently on carvedilol with an elevated dose recently.  She is continue to have palpitations.  She would prefer to avoid medications.  Due to that, we  plan for ablation.  Risk and benefits have been discussed.  Missing bleeding, tamponade, heart block, stroke,, MI, death, renal failure.  She understands these risks and has agreed to the procedure.  In the interim, we  start her on flecainide 50 mg with an EKG in 2 weeks.  2.  Hypertension: Currently well-controlled  Follow up with Dr. Elberta Fortis as usual post procedure  Signed,  Jorja Loa, MD

## 2022-11-18 NOTE — Patient Instructions (Signed)
Medication Instructions:  Your physician has recommended you make the following change in your medication:   1) START flecainide 50mg  twice daily   *If you need a refill on your cardiac medications before your next appointment, please call your pharmacy*   Lab Work: 1 week prior to CT scan: CBC, BMET (you do not need to be fasting) You may come to the lab any time between 7:30am and 4:30pm. If you have labs (blood work) drawn today and your tests are completely normal, you will receive your results only by: MyChart Message (if you have MyChart) OR A paper copy in the mail If you have any lab test that is abnormal or we need to change your treatment, we will call you to review the results.   Testing/Procedures: Your physician has requested that you have cardiac CT. Cardiac computed tomography (CT) is a painless test that uses an x-ray machine to take clear, detailed pictures of your heart. For further information please visit [email or web address] Please follow instruction sheet as given.   Your physician has recommended that you have an ablation. Catheter ablation is a medical procedure used to treat some cardiac arrhythmias (irregular heartbeats). During catheter ablation, a long, thin, flexible tube is put into a blood vessel in your groin (upper thigh), or neck. This tube is called an ablation catheter. It is then guided to your heart through the blood vessel. Radio frequency waves destroy small areas of heart tissue where abnormal heartbeats may cause an arrhythmia to start.    The EP procedure scheduler, April G, will call you to schedule your procedure and review instructions with you. Please allow 1-2 weeks for her to contact you.   Follow-Up: At [facility], you and your health needs are our priority.  As part of our continuing mission to provide you with exceptional heart care, we have created designated Provider Care Teams.  These Care Teams include your primary Cardiologist  (physician) and Advanced Practice Providers (APPs -  Physician Assistants and Nurse Practitioners) who all work together to provide you with the care you need, when you need it.   Your next appointment:   2 weeks Nurse Visit for EKG after flecainide start 4 week(s) after your ablation with [facility]   Provider:   You will follow up in the Atrial Fibrillation Clinic located at [facility] Your provider will be: Clint R. Fenton, PA-C A[facility]

## 2022-11-19 ENCOUNTER — Telehealth: Payer: Self-pay | Admitting: Cardiology

## 2022-11-19 ENCOUNTER — Ambulatory Visit: Payer: Medicare Other | Admitting: Cardiology

## 2022-11-19 NOTE — Telephone Encounter (Signed)
Left message for patient informing her she does need to keep appt with Jari Favre, PA-C on 11/26/22.  Per Tessa: Yes, we have to repeat a lipid panel.  Please explain to the patient that Dr. Elberta Fortis will only talk about her heart rhythm but general cardiology (me) we will discuss all aspects of her cardiovascular health.  Thanks.  Sharlene Dory, PA-C    Provided office number for callback if any questions.

## 2022-11-19 NOTE — Telephone Encounter (Signed)
Pt would like to know if she needs the appt on 8/23 because she had a appt yesterday 08/15 with Dr. Elberta Fortis. Please advise

## 2022-11-22 DIAGNOSIS — M9903 Segmental and somatic dysfunction of lumbar region: Secondary | ICD-10-CM | POA: Diagnosis not present

## 2022-11-22 DIAGNOSIS — M25551 Pain in right hip: Secondary | ICD-10-CM | POA: Diagnosis not present

## 2022-11-22 DIAGNOSIS — M9905 Segmental and somatic dysfunction of pelvic region: Secondary | ICD-10-CM | POA: Diagnosis not present

## 2022-11-22 DIAGNOSIS — M5116 Intervertebral disc disorders with radiculopathy, lumbar region: Secondary | ICD-10-CM | POA: Diagnosis not present

## 2022-11-23 DIAGNOSIS — M5116 Intervertebral disc disorders with radiculopathy, lumbar region: Secondary | ICD-10-CM | POA: Diagnosis not present

## 2022-11-23 DIAGNOSIS — M9903 Segmental and somatic dysfunction of lumbar region: Secondary | ICD-10-CM | POA: Diagnosis not present

## 2022-11-23 DIAGNOSIS — M25551 Pain in right hip: Secondary | ICD-10-CM | POA: Diagnosis not present

## 2022-11-23 DIAGNOSIS — M9905 Segmental and somatic dysfunction of pelvic region: Secondary | ICD-10-CM | POA: Diagnosis not present

## 2022-11-25 NOTE — Progress Notes (Signed)
Cardiology Office Note:  .   Date:  11/25/2022  ID:  Linda Harvey, DOB 1951-02-21, MRN [id number] PCP: Merri Brunette, MD  [facility] Providers Cardiologist:  Bryan Lemma, MD {  History of Present Illness: .   Linda Harvey is a 72 y.o. female with a past medical history of CAD, PSVT, hypertension, hyperlipidemia, CKD stage III, and adjustment disorder with depressed mood who presents for hospital follow-up.  She was last seen in clinic 01/2022.  History includes being followed by Dr. Herbie Baltimore since 2016 for evaluation of exertional chest pain.  Echocardiogram 06/2014 showed LVEF 55 to 60%, no RWMA, G1 DD, mild to moderate tricuspid valve regurgitation.  Treadmill stress test 3/16 showed good exercise capacity, normal perfusion, low risk.  Has not been seen in follow-up since.  Was seen in the ED September 2023 after her husband found her in the bathtub asleep.  And depressed about her son's death 4 years prior and had drink 2 glasses of wine in the evening prior to falling asleep in the bathtub.  Evaluated by psych was determined that she was not suicidal.  Returns to ED 01/09/2022 with complaint of chest tightness, left arm pain while hiking.  EKG showed SVT, heart rate 167 bpm.  Rhythm broke prior to administration of adenosine.  Troponin was elevated.  Transfer to [facility] for further evaluation.  Cardiology consulted.  Started metoprolol.  Was noted that if she should have recurrent AVNRT would likely benefit from EP evaluation.  Troponin was thought to be elevated due to demand ischemia in setting of SVT.  Echo showed EF 65%, normal LV function, G1 DD, mild mitral valve regurgitation, moderate MAC.  Coronary CTA showed coronary calcium score 73.7 (63rd percentile), mild to moderate atherosclerosis of LAD and RCA, negative FFR.  Of note BP mildly elevated during admission.  Was seen in follow-up 01/2022 and have been stable from a cardiac standpoint.  Denies recurrent  palpitations, dizziness, syncope, presyncope, chest pain.  Tolerating metoprolol.  Was recently in the ED 09/13/2022 for SVT.  On arrival, heart rates were in the 150s to 160s.  EKG did not show STEMI.  Chest nontender.  A vagal maneuver was attempted without success.  6 mg of adenosine was utilized without any complication and she was converted back to normal sinus rhythm.  Resolution of palpitations and chest tightness as well as shortness of breath.  TSH was pending at the time of discharge.  She was seen by me June 2024, she tells me that she has terrible restless leg syndrome.  She uses hemp/CBD drops and return for this is the only thing that helps.  She tells me she has no energy at all.  She also is having some shortness of breath and dizziness.  She was started on metoprolol succinate for her palpitations/SVT.  She does not feel good on this medication and wants to try something different.  Discussed the importance of the beta-blocker in the setting of SVT.  Ended up prescribing her carvedilol to try.  This should not have any interactions with her CBD oil but will also forward on the pharmacy.  3 separate ED visits past month for PT/palpitations.  Started on flecainide with resolution of symptoms.  Today, she feels like she controlled with the flecainide with her symptoms.  She was in the ED 3 times in a row for a fast heartbeat.  Since flecainide was added to her routine by Dr. Elberta Fortis her heart rate is much better controlled.  However, she has struggled with some side effects including double vision, and fatigue.  She states that all of her cardiac symptoms she feels are from the COVID-vaccine that she had 3 to 4 years ago.  Her daughter [name] is a Engineer, civil (consulting) and she has done a lot of research COVID-vaccine and feels that it has propelled a lot of her current symptoms.  Ablation discussed point she would like to further discuss this with Dr. Elberta Fortis as an option because she does not want to be reliant  on medications for the rest of her life.   Reports no shortness of breath nor dyspnea on exertion. Reports no chest pain, pressure, or tightness. No edema, orthopnea, PND. Reports no palpitations.   ROS: Refer to HPI for pertinent ROS.  Studies Reviewed: Marland Kitchen        Coronary CTA 10/23 showed calcium score of 73.7.  63rd percentile for age, sex, and reached max controls.  Mild to moderate atherosclerosis of the LAD and RCA.  Recommended preventative therapy and risk factor modification.  FFR negative.       Physical Exam:   VS:  There were no vitals taken for this visit.   Wt Readings from Last 3 Encounters:  11/18/22 178 lb 6.4 oz (80.9 kg)  11/16/22 177 lb 14.6 oz (80.7 kg)  11/13/22 177 lb 14.6 oz (80.7 kg)    GEN: Well nourished, well developed in no acute distress NECK: No JVD; No carotid bruits CARDIAC: RRR, no murmurs, rubs, gallops RESPIRATORY: Clear to auscultation bilaterally in all fields ABDOMEN: Soft, non-tender, non-distended EXTREMITIES:  No edema; No deformity   ASSESSMENT AND PLAN: .   1.  PSVT/palpitations -Continue current medications which include carvedilol 6.25 twice daily, diltiazem 30 as needed, flecainide 50 mg twice a day, lisinopril 20 mg daily, Crestor 20 mg at night, aspirin 81 mg daily, Norvasc 5 mg daily -Follow-up EKG next week -Follow-up with Dr. Elberta Fortis in a few weeks to review flecainide dosing and for ablation conversation  2.  Elevated troponin/nonobstructive CAD -Continue current medication regimen -Coronary CTA from October reviewed -No ischemic workup indicated at this time  3.  Hypertension -Blood pressure well-controlled on current medications -Would continue current medications and keep track of -Continue low-sodium, heart healthy diet   4.  Hyperlipidemia -LDL 57 -She will be due for repeat lipid panel until June 2025  Dispo: She can follow-up in a few weeks with Dr. Elberta Fortis and in 6 months with Dr. Herbie Baltimore.  Signed, Sharlene Dory, PA-C

## 2022-11-26 ENCOUNTER — Encounter: Payer: Self-pay | Admitting: Physician Assistant

## 2022-11-26 ENCOUNTER — Ambulatory Visit: Payer: Medicare Other | Attending: Physician Assistant | Admitting: Physician Assistant

## 2022-11-26 VITALS — BP 120/60 | HR 57 | Ht 64.0 in

## 2022-11-26 DIAGNOSIS — I471 Supraventricular tachycardia, unspecified: Secondary | ICD-10-CM | POA: Diagnosis not present

## 2022-11-26 DIAGNOSIS — R7989 Other specified abnormal findings of blood chemistry: Secondary | ICD-10-CM | POA: Diagnosis not present

## 2022-11-26 DIAGNOSIS — E785 Hyperlipidemia, unspecified: Secondary | ICD-10-CM | POA: Insufficient documentation

## 2022-11-26 DIAGNOSIS — I2510 Atherosclerotic heart disease of native coronary artery without angina pectoris: Secondary | ICD-10-CM | POA: Insufficient documentation

## 2022-11-26 DIAGNOSIS — R002 Palpitations: Secondary | ICD-10-CM | POA: Insufficient documentation

## 2022-11-26 DIAGNOSIS — I10 Essential (primary) hypertension: Secondary | ICD-10-CM | POA: Diagnosis not present

## 2022-11-26 NOTE — Patient Instructions (Signed)
Medication Instructions:  Your physician recommends that you continue on your current medications as directed. Please refer to the Current Medication list given to you today. *If you need a refill on your cardiac medications before your next appointment, please call your pharmacy*   Lab Work: None ordered   Testing/Procedures: None ordered   Follow-Up: At [facility], you and your health needs are our priority.  As part of our continuing mission to provide you with exceptional heart care, we have created designated Provider Care Teams.  These Care Teams include your primary Cardiologist (physician) and Advanced Practice Providers (APPs -  Physician Assistants and Nurse Practitioners) who all work together to provide you with the care you need, when you need it.  We recommend signing up for the patient portal called "MyChart".  Sign up information is provided on this After Visit Summary.  MyChart is used to connect with patients for Virtual Visits (Telemedicine).  Patients are able to view lab/test results, encounter notes, upcoming appointments, etc.  Non-urgent messages can be sent to your provider as well.   To learn more about what you can do with MyChart, go to [email or web address].    Your next appointment:   6 month(s)  Provider:   Bryan Lemma, MD    3-4 weeks Dr Elberta Fortis or EP APP   Other Instructions

## 2022-11-29 DIAGNOSIS — M25551 Pain in right hip: Secondary | ICD-10-CM | POA: Diagnosis not present

## 2022-11-29 DIAGNOSIS — M9903 Segmental and somatic dysfunction of lumbar region: Secondary | ICD-10-CM | POA: Diagnosis not present

## 2022-11-29 DIAGNOSIS — M9905 Segmental and somatic dysfunction of pelvic region: Secondary | ICD-10-CM | POA: Diagnosis not present

## 2022-11-29 DIAGNOSIS — M5116 Intervertebral disc disorders with radiculopathy, lumbar region: Secondary | ICD-10-CM | POA: Diagnosis not present

## 2022-12-02 ENCOUNTER — Ambulatory Visit: Payer: Medicare Other | Attending: Cardiovascular Disease

## 2022-12-02 VITALS — BP 136/76 | HR 54 | Resp 11 | Wt 176.0 lb

## 2022-12-02 DIAGNOSIS — I471 Supraventricular tachycardia, unspecified: Secondary | ICD-10-CM | POA: Diagnosis not present

## 2022-12-02 NOTE — Patient Instructions (Signed)
Medication Instructions:  STOP THE CARVEDILOL 3.125 MG AND ONLY TAKE THE CARVEDILOL 6.25 MG TWICE A DAY   *If you need a refill on your cardiac medications before your next appointment, please call your pharmacy*   Lab Work:  If you have labs (blood work) drawn today and your tests are completely normal, you will receive your results only by: MyChart Message (if you have MyChart) OR A paper copy in the mail If you have any lab test that is abnormal or we need to change your treatment, we will call you to review the results.   Testing/Procedures:    Follow-Up: At [facility], you and your health needs are our priority.  As part of our continuing mission to provide you with exceptional heart care, we have created designated Provider Care Teams.  These Care Teams include your primary Cardiologist (physician) and Advanced Practice Providers (APPs -  Physician Assistants and Nurse Practitioners) who all work together to provide you with the care you need, when you need it.  We recommend signing up for the patient portal called "MyChart".  Sign up information is provided on this After Visit Summary.  MyChart is used to connect with patients for Virtual Visits (Telemedicine).  Patients are able to view lab/test results, encounter notes, upcoming appointments, etc.  Non-urgent messages can be sent to your provider as well.   To learn more about what you can do with MyChart, go to [email or web address].

## 2022-12-02 NOTE — Progress Notes (Signed)
   Nurse Visit   Date of Encounter: 12/02/2022 ID: Dyeisha Popma, DOB February 01, 1951, MRN [id number]  PCP:  Merri Brunette, MD   [facility] Providers Cardiologist:  Bryan Lemma, MD      Visit Details   VS:  BP 136/76 (BP Location: Right Arm, Patient Position: Sitting, Cuff Size: Normal)   Pulse (!) 54   Resp 11   Wt 176 lb (79.8 kg)   SpO2 98%   BMI 30.21 kg/m  , BMI Body mass index is 30.21 kg/m.  Wt Readings from Last 3 Encounters:  12/02/22 176 lb (79.8 kg)  11/18/22 178 lb 6.4 oz (80.9 kg)  11/16/22 177 lb 14.6 oz (80.7 kg)     Reason for visit: EKG AFTER STARTING FLECAINIDE  Performed today: VS, EKG, MED COUNSELING  Changes (medications, testing, etc.) : PT WAS TAKING BOTH CARVEDILOL 6.25 MG AND 3.125 MG BID AND ADVISED TO STOP THE 3.125 MG TABS (HR TODAY 54) Length of Visit: 15 minutes   EKG SINUS BRADY RATE 54.Marland Kitchen QT 436/413   Medications Adjustments/Labs and Tests Ordered: Orders Placed This Encounter  Procedures   EKG 12-Lead   No orders of the defined types were placed in this encounter.  Pt says she has had much improvement since starting the Flecainide. She still has had some episode of palps and mild dizziness when out in the heat. Per Jari Favre that recently saw the pt... she will avoid the heat and be sure she is hydrating well. She will continue to monitor and let us know if anything worsens. Plan given to the Dod Dr Clifton James and he has agreed.   Signed, Bertram Millard, RN  12/02/2022 11:37 AM

## 2022-12-07 DIAGNOSIS — M25551 Pain in right hip: Secondary | ICD-10-CM | POA: Diagnosis not present

## 2022-12-07 DIAGNOSIS — M5116 Intervertebral disc disorders with radiculopathy, lumbar region: Secondary | ICD-10-CM | POA: Diagnosis not present

## 2022-12-07 DIAGNOSIS — M9905 Segmental and somatic dysfunction of pelvic region: Secondary | ICD-10-CM | POA: Diagnosis not present

## 2022-12-07 DIAGNOSIS — M9903 Segmental and somatic dysfunction of lumbar region: Secondary | ICD-10-CM | POA: Diagnosis not present

## 2022-12-13 ENCOUNTER — Telehealth: Payer: Self-pay

## 2022-12-13 DIAGNOSIS — I471 Supraventricular tachycardia, unspecified: Secondary | ICD-10-CM

## 2022-12-13 NOTE — Telephone Encounter (Signed)
Pt is scheduled for an SVT Ablation on 10/3 at 1:30 PM. She will come to [facility] on 9/20 for labs.

## 2022-12-24 ENCOUNTER — Ambulatory Visit: Payer: Medicare Other | Attending: Cardiology

## 2022-12-24 DIAGNOSIS — I471 Supraventricular tachycardia, unspecified: Secondary | ICD-10-CM

## 2022-12-25 LAB — BASIC METABOLIC PANEL
BUN/Creatinine Ratio: 15 (ref 12–28)
BUN: 14 mg/dL (ref 8–27)
CO2: 25 mmol/L (ref 20–29)
Calcium: 9.1 mg/dL (ref 8.7–10.3)
Chloride: 108 mmol/L — ABNORMAL HIGH (ref 96–106)
Creatinine, Ser: 0.93 mg/dL (ref 0.57–1.00)
Glucose: 77 mg/dL (ref 70–99)
Potassium: 4.4 mmol/L (ref 3.5–5.2)
Sodium: 146 mmol/L — ABNORMAL HIGH (ref 134–144)
eGFR: 65 mL/min/{1.73_m2} (ref >59)

## 2022-12-25 LAB — CBC
Hematocrit: 37.3 % (ref 34.0–46.6)
Hemoglobin: 12.2 g/dL (ref 11.1–15.9)
MCH: 33.2 pg — ABNORMAL HIGH (ref 26.6–33.0)
MCHC: 32.7 g/dL (ref 31.5–35.7)
MCV: 102 fL — ABNORMAL HIGH (ref 79–97)
Platelets: 224 10*3/uL (ref 150–450)
RBC: 3.67 x10E6/uL — ABNORMAL LOW (ref 3.77–5.28)
RDW: 11.9 % (ref 11.7–15.4)
WBC: 4.6 10*3/uL (ref 3.4–10.8)

## 2023-01-05 NOTE — Pre-Procedure Instructions (Signed)
Attempted to call patient regarding procedure instructions.  Left voicemail on the following.  Instructed patient on the following items: Arrival time 1100 Nothing to eat or drink after midnight No meds AM of procedure Responsible person to drive you home and stay with you for 24 hrs

## 2023-01-06 ENCOUNTER — Ambulatory Visit (HOSPITAL_COMMUNITY): Payer: Medicare Other | Admitting: Anesthesiology

## 2023-01-06 ENCOUNTER — Ambulatory Visit (HOSPITAL_COMMUNITY)
Admission: RE | Admit: 2023-01-06 | Discharge: 2023-01-06 | Disposition: A | Discharge status: Home / Self Care | Payer: Medicare Other | Attending: Cardiology | Admitting: Cardiology

## 2023-01-06 ENCOUNTER — Encounter (HOSPITAL_COMMUNITY)
Admission: RE | Disposition: A | Discharge status: Home / Self Care | Payer: Self-pay | Source: Home / Self Care | Attending: Cardiology

## 2023-01-06 ENCOUNTER — Other Ambulatory Visit: Payer: Self-pay

## 2023-01-06 DIAGNOSIS — I10 Essential (primary) hypertension: Secondary | ICD-10-CM | POA: Insufficient documentation

## 2023-01-06 DIAGNOSIS — I340 Nonrheumatic mitral (valve) insufficiency: Secondary | ICD-10-CM | POA: Insufficient documentation

## 2023-01-06 DIAGNOSIS — Z6829 Body mass index (BMI) 29.0-29.9, adult: Secondary | ICD-10-CM | POA: Insufficient documentation

## 2023-01-06 DIAGNOSIS — I4719 Other supraventricular tachycardia: Secondary | ICD-10-CM

## 2023-01-06 DIAGNOSIS — E785 Hyperlipidemia, unspecified: Secondary | ICD-10-CM | POA: Insufficient documentation

## 2023-01-06 DIAGNOSIS — Z8673 Personal history of transient ischemic attack (TIA), and cerebral infarction without residual deficits: Secondary | ICD-10-CM | POA: Insufficient documentation

## 2023-01-06 DIAGNOSIS — I2510 Atherosclerotic heart disease of native coronary artery without angina pectoris: Secondary | ICD-10-CM | POA: Insufficient documentation

## 2023-01-06 DIAGNOSIS — E669 Obesity, unspecified: Secondary | ICD-10-CM | POA: Diagnosis not present

## 2023-01-06 HISTORY — PX: SVT ABLATION: EP1225

## 2023-01-06 SURGERY — SVT ABLATION
Anesthesia: Monitor Anesthesia Care

## 2023-01-06 MED ORDER — FENTANYL CITRATE (PF) 100 MCG/2ML IJ SOLN
INTRAMUSCULAR | Status: DC | PRN
  Administered 2023-01-06 (×2): 50 ug via INTRAVENOUS
  Administered 2023-01-06 (×4): 25 ug via INTRAVENOUS

## 2023-01-06 MED ORDER — SODIUM CHLORIDE 0.9% FLUSH: 3.0000 mL | Freq: Two times a day (BID) | INTRAVENOUS | Status: DC

## 2023-01-06 MED ORDER — PHENYLEPHRINE 80 MCG/ML (10ML) SYRINGE FOR IV PUSH (FOR BLOOD PRESSURE SUPPORT)
PREFILLED_SYRINGE | INTRAVENOUS | Status: DC | PRN
  Administered 2023-01-06: 80 ug via INTRAVENOUS

## 2023-01-06 MED ORDER — ONDANSETRON HCL 4 MG/2ML IJ SOLN
INTRAMUSCULAR | Status: DC | PRN
  Administered 2023-01-06: 4 mg via INTRAVENOUS

## 2023-01-06 MED ORDER — DEXMEDETOMIDINE HCL IN NACL 80 MCG/20ML IV SOLN
INTRAVENOUS | Status: DC | PRN
Start: 2023-01-06 — End: 2023-01-06
  Administered 2023-01-06: 12 ug via INTRAVENOUS

## 2023-01-06 MED ORDER — SODIUM CHLORIDE 0.9 % IV SOLN: 250.0000 mL | INTRAVENOUS | Status: DC | PRN

## 2023-01-06 MED ORDER — BUPIVACAINE HCL (PF) 0.25 % IJ SOLN
INTRAMUSCULAR | Status: DC | PRN
  Administered 2023-01-06: 30 mL

## 2023-01-06 MED ORDER — ISOPROTERENOL HCL 0.2 MG/ML IJ SOLN
INTRAMUSCULAR | Status: AC
  Filled 2023-01-06: qty 5

## 2023-01-06 MED ORDER — ONDANSETRON HCL 4 MG/2ML IJ SOLN: 4.0000 mg | Freq: Four times a day (QID) | INTRAMUSCULAR | Status: DC | PRN

## 2023-01-06 MED ORDER — SODIUM CHLORIDE 0.9 % IV SOLN: INTRAVENOUS | Status: DC

## 2023-01-06 MED ORDER — PROPOFOL 500 MG/50ML IV EMUL
INTRAVENOUS | Status: DC | PRN
Start: 2023-01-06 — End: 2023-01-06
  Administered 2023-01-06: 50 ug/kg/min via INTRAVENOUS
  Administered 2023-01-06: 20 mg via INTRAVENOUS

## 2023-01-06 MED ORDER — ISOPROTERENOL HCL 0.2 MG/ML IJ SOLN
INTRAVENOUS | Status: DC | PRN
  Administered 2023-01-06: 2 ug/min via INTRAVENOUS

## 2023-01-06 MED ORDER — PROPOFOL 10 MG/ML IV BOLUS
INTRAVENOUS | Status: DC | PRN
  Administered 2023-01-06 (×2): 20 mg via INTRAVENOUS

## 2023-01-06 MED ORDER — ACETAMINOPHEN 325 MG PO TABS: 650.0000 mg | ORAL_TABLET | ORAL | Status: DC | PRN

## 2023-01-06 MED ORDER — HEPARIN (PORCINE) IN NACL 1000-0.9 UT/500ML-% IV SOLN
INTRAVENOUS | Status: DC | PRN
  Administered 2023-01-06: 500 mL

## 2023-01-06 MED ORDER — SODIUM CHLORIDE 0.9% FLUSH: 3.0000 mL | INTRAVENOUS | Status: DC | PRN

## 2023-01-06 MED ORDER — ACETAMINOPHEN 500 MG PO TABS
1000.0000 mg | ORAL_TABLET | Freq: Once | ORAL | Status: AC
  Administered 2023-01-06: 1000 mg via ORAL
  Filled 2023-01-06: qty 2

## 2023-01-06 SURGICAL SUPPLY — 12 items
BAG SNAP BAND KOVER 36X36 (MISCELLANEOUS) IMPLANT
CATH EZ STEER NAV 4MM D-F CUR (ABLATOR) IMPLANT
CATH JOSEPH QUAD ALLRED 6F REP (CATHETERS) IMPLANT
CATH WEBSTER BI DIR CS D-F CRV (CATHETERS) IMPLANT
CLOSURE MYNX CONTROL 6F/7F (Vascular Products) IMPLANT
PACK EP LATEX FREE (CUSTOM PROCEDURE TRAY) ×1
PACK EP LF (CUSTOM PROCEDURE TRAY) ×1 IMPLANT
PAD DEFIB RADIO PHYSIO CONN (PAD) ×1 IMPLANT
PATCH CARTO3 (PAD) IMPLANT
SHEATH PINNACLE 6F 10CM (SHEATH) IMPLANT
SHEATH PINNACLE 7F 10CM (SHEATH) IMPLANT
SHEATH PINNACLE 8F 10CM (SHEATH) IMPLANT

## 2023-01-06 NOTE — Transfer of Care (Signed)
Immediate Anesthesia Transfer of Care Note  Patient: Linda Harvey  Procedure(s) Performed: SVT ABLATION  Patient Location: PACU and Cath Lab  Anesthesia Type:MAC  Level of Consciousness: awake  Airway & Oxygen Therapy: Patient Spontanous Breathing  Post-op Assessment: Report given to RN  Post vital signs: stable  Last Vitals:  Vitals Value Taken Time  BP    Temp    Pulse    Resp    SpO2      Last Pain:  Vitals:   01/06/23 0833  TempSrc: Oral         Complications: There were no known notable events for this encounter.

## 2023-01-06 NOTE — H&P (Signed)
Electrophysiology Office Note:   Date:  01/06/2023  ID:  Linda Harvey, DOB [birth year], MRN [id number]  Primary Cardiologist: Bryan Lemma, MD Electrophysiologist: None      History of Present Illness:   Linda Harvey is a 72 y.o. female with h/o SVT, mitral regurgitation, hypertension, hyperlipidemia, obesity, mild coronary artery disease seen today for  for Electrophysiology evaluation of SVT at the request of Linda Harvey.      She was admitted to the hospital October 2023 with SVT.  She presented with chest tightness.  She was given adenosine which terminated the SVT back to sinus rhythm.  She had a normal echo.  Coronary CTA showed a calcium score of 74.  She presented to the emergency room multiple times with episodes of SVT.  Most recently, she presented with heart rates in the 160s to 170s.  She tried vagal maneuvers but this did not terminate tachycardia.  SVT resolved prior to arrival to the emergency room.  She does notice that alcohol affects her SVT.      Today, denies symptoms of palpitations, chest pain, shortness of breath, orthopnea, PND, lower extremity edema, claudication, dizziness, presyncope, syncope, bleeding, or neurologic sequela. The patient is tolerating medications without difficulties. Plan ablation today.   EP Information / Studies Reviewed:    EKG is not ordered today. EKG from 11/16/22 reviewed which showed sinus rhythm        Risk Assessment/Calculations:             Physical Exam:   VS:  BP (!) 157/65   Pulse (!) 57   Temp 97.6 F (36.4 C) (Oral)   Resp 20   Ht 5\' 4"  (1.626 m)   Wt 77.6 kg   SpO2 98%   BMI 29.35 kg/m    Wt Readings from Last 3 Encounters:  01/06/23 77.6 kg  12/02/22 79.8 kg  11/18/22 80.9 kg     GEN: Well nourished, well developed in no acute distress NECK: No JVD; No carotid bruits CARDIAC: Regular rate and rhythm, no murmurs, rubs, gallops RESPIRATORY:  Clear to auscultation without rales, wheezing or  rhonchi  ABDOMEN: Soft, non-tender, non-distended EXTREMITIES:  No edema; No deformity   ASSESSMENT AND PLAN:    1.  SVT: Lowana Monier has presented today for surgery, with the diagnosis of SVT.  The various methods of treatment have been discussed with the patient and family. After consideration of risks, benefits and other options for treatment, the patient has consented to  Procedure(s): Catheter ablation as a surgical intervention .  Risks include but not limited to complete heart block, stroke, esophageal damage, nerve damage, bleeding, vascular damage, tamponade, perforation, MI, and death. The patient's history has been reviewed, patient examined, no change in status, stable for surgery.  I have reviewed the patient's chart and labs.  Questions were answered to the patient's satisfaction.    [name] Elberta Fortis, MD 01/06/2023 9:24 AM

## 2023-01-06 NOTE — Discharge Instructions (Signed)

## 2023-01-06 NOTE — Anesthesia Postprocedure Evaluation (Signed)
Anesthesia Post Note  Patient: Linda Harvey  Procedure(s) Performed: SVT ABLATION     Patient location during evaluation: PACU Anesthesia Type: MAC Level of consciousness: awake and alert Pain management: pain level controlled Vital Signs Assessment: post-procedure vital signs reviewed and stable Respiratory status: spontaneous breathing, nonlabored ventilation, respiratory function stable and patient connected to nasal cannula oxygen Cardiovascular status: stable and blood pressure returned to baseline Postop Assessment: no apparent nausea or vomiting Anesthetic complications: no  There were no known notable events for this encounter.  Last Vitals:  Vitals:   01/06/23 1330 01/06/23 1400  BP: 138/70 (!) 152/66  Pulse: (!) 55 (!) 55  Resp: 11 14  Temp:    SpO2: 95% 95%    Last Pain:  Vitals:   01/06/23 1220  TempSrc:   PainSc: 0-No pain                 Kennieth Rad

## 2023-01-06 NOTE — Anesthesia Preprocedure Evaluation (Signed)
Anesthesia Evaluation  Patient identified by MRN, date of birth, ID band Patient awake    Reviewed: Allergy & Precautions, NPO status , Patient's Chart, lab work & pertinent test results  Airway Mallampati: II  TM Distance: >3 FB Neck ROM: Full    Dental  (+) Dental Advisory Given   Pulmonary neg pulmonary ROS   breath sounds clear to auscultation       Cardiovascular hypertension, Pt. on medications and Pt. on home beta blockers + DOE  + dysrhythmias Atrial Fibrillation + Valvular Problems/Murmurs  Rhythm:Regular Rate:Normal     Neuro/Psych TIA   GI/Hepatic negative GI ROS, Neg liver ROS,,,  Endo/Other  negative endocrine ROS    Renal/GU negative Renal ROS     Musculoskeletal   Abdominal   Peds  Hematology negative hematology ROS (+)   Anesthesia Other Findings   Reproductive/Obstetrics                             Anesthesia Physical Anesthesia Plan  ASA: 2  Anesthesia Plan: MAC   Post-op Pain Management: Tylenol PO (pre-op)*   Induction:   PONV Risk Score and Plan: 2 and Propofol infusion and Ondansetron  Airway Management Planned: Natural Airway and Simple Face Mask  Additional Equipment:   Intra-op Plan:   Post-operative Plan:   Informed Consent: I have reviewed the patients History and Physical, chart, labs and discussed the procedure including the risks, benefits and alternatives for the proposed anesthesia with the patient or authorized representative who has indicated his/her understanding and acceptance.       Plan Discussed with: CRNA  Anesthesia Plan Comments:        Anesthesia Quick Evaluation

## 2023-01-07 ENCOUNTER — Encounter (HOSPITAL_COMMUNITY): Payer: Self-pay | Admitting: Cardiology

## 2023-02-06 NOTE — Progress Notes (Unsigned)
Cardiology Office Note:  .   Date:  02/06/2023  ID:  Linda Harvey, DOB 1950-05-09, MRN [id number] PCP: Merri Brunette, MD   [facility] Providers Cardiologist:  Bryan Lemma, MD { EP: Dr. Elberta Fortis  History of Present Illness: .   Linda Harvey is a 72 y.o. female w/PMHx of HTN, HLD, nonobstructive CAD, obesity, SVT  Referred to Dr. Elberta Fortis for SVT, palpitations, saw him 11/18/22, in review, EKG c/w short RP tachycardia.  Planned for ablation though in the meantime started on flecainide  She saw T. Asa Lente, PA-C 11/26/22, pt felt strongly her SVT/up-tick in symptoms 2/2 COVID vaccine some years prior.  Palpitations better on flecainide though having some new symptoms (double vision and fatigue) since starting drug  01/06/23: SVT/AVNRT ablation   Today's visit is scheduled as 4 week post ablation  ROS:   She feels very well No palpitations or tachycardias since her procedure. No CP, groin/procedure site concerns, reports she has healed up great and is extremely happy with hhow things have gone. She is very active, her husband a bodybuilder and their lives spent staying active and at the gym. Her tachycardia had become life impacting and she is very glad to be back at it. She has noticed a little bit of gait instability as she has gotten older, but otherwis eno exertional intolerances of any kind No CP, SOB, DOE No near syncope or syncope.  Her home BP much better, reports 130's and every time she comes to the doctor shoots up  AAD hx Flecainide started Aug 2024 >> stopped post ablation Oct 2024   Studies Reviewed: Marland Kitchen    EKG done today and reviewed by myself:  SB 50bpm, normal intervals   01/06/23; EPS/ablation CONCLUSIONS:  1. Sinus rhythm upon presentation.  2. The patient had dual AV nodal physiology with easily inducible classic AV nodal reentrant tachycardia, there were no other accessory pathways or arrhythmias induced  3. Successful radiofrequency modification  of the slow AV nodal pathway  4. No inducible arrhythmias following ablation.  5. No early apparent complications.   01/10/22: TTE 1. Left ventricular ejection fraction, by estimation, is 60 to 65%. The  left ventricle has normal function. The left ventricle has no regional  wall motion abnormalities. Left ventricular diastolic parameters are  consistent with Grade I diastolic  dysfunction (impaired relaxation).   2. Right ventricular systolic function is normal. The right ventricular  size is normal. There is normal pulmonary artery systolic pressure. The  estimated right ventricular systolic pressure is 28.0 mmHg.   3. The mitral valve is normal in structure. Mild mitral valve  regurgitation. No evidence of mitral stenosis. Moderate mitral annular  calcification.   4. The aortic valve is normal in structure. Aortic valve regurgitation is  not visualized. No aortic stenosis is present.   5. The inferior vena cava is normal in size with greater than 50%  respiratory variability, suggesting right atrial pressure of 3 mmHg.   01/10/22: FFR IMPRESSION: 1.Coronary CTA FFR analysis demonstrates no hemodynamically flow limiting lesions. 01/10/22: Coronary CT IMPRESSION: 1. Coronary calcium score of 73.7. This was 63rd percentile for age-, sex, and race-matched controls. 2.  Normal coronary origin with right dominance. 3. Mild to moderate atherosclerosis of the LAD and RCA. CAD RADS 2. 4.  Recommend preventive therapy and risk factor modification. 5. This study has been submitted for FFR analysis for assessment of the proximal RCA.  Risk Assessment/Calculations:    Physical Exam:   VS:  There were  no vitals taken for this visit.   Wt Readings from Last 3 Encounters:  01/06/23 171 lb (77.6 kg)  12/02/22 176 lb (79.8 kg)  11/18/22 178 lb 6.4 oz (80.9 kg)    GEN: Well nourished, well developed in no acute distress NECK: No JVD; No carotid bruits CARDIAC: RRR, no murmurs, rubs,  gallops RESPIRATORY:  CTA b/l without rales, wheezing or rhonchi  ABDOMEN: Soft, non-tender, non-distended EXTREMITIES:  No edema; No deformity    ASSESSMENT AND PLAN: .    AVNRT SVT S/p ablation Feel great No post procedural concerns, no palpitations  2. HTN 160/80 on recheck She reorts much better numbers at home w/"white coat" high numbers  Discussed PRN visits with EP, she would like a 6mo check in, will see her sooner if needed  Dispo: as above  Signed, Sheilah Pigeon, PA-C

## 2023-02-09 ENCOUNTER — Encounter: Payer: Self-pay | Admitting: Physician Assistant

## 2023-02-09 ENCOUNTER — Ambulatory Visit: Payer: Medicare Other | Attending: Physician Assistant | Admitting: Physician Assistant

## 2023-02-09 VITALS — BP 162/84 | HR 50 | Ht 64.0 in

## 2023-02-09 DIAGNOSIS — I10 Essential (primary) hypertension: Secondary | ICD-10-CM | POA: Insufficient documentation

## 2023-02-09 DIAGNOSIS — I471 Supraventricular tachycardia, unspecified: Secondary | ICD-10-CM | POA: Diagnosis not present

## 2023-02-09 NOTE — Patient Instructions (Signed)
Medication Instructions:  Your physician recommends that you continue on your current medications as directed. Please refer to the Current Medication list given to you today. *If you need a refill on your cardiac medications before your next appointment, please call your pharmacy*   Follow-Up: At [facility], you and your health needs are our priority.  As part of our continuing mission to provide you with exceptional heart care, we have created designated Provider Care Teams.  These Care Teams include your primary Cardiologist (physician) and Advanced Practice Providers (APPs -  Physician Assistants and Nurse Practitioners) who all work together to provide you with the care you need, when you need it.  We recommend signing up for the patient portal called "MyChart".  Sign up information is provided on this After Visit Summary.  MyChart is used to connect with patients for Virtual Visits (Telemedicine).  Patients are able to view lab/test results, encounter notes, upcoming appointments, etc.  Non-urgent messages can be sent to your provider as well.   To learn more about what you can do with MyChart, go to [email or web address].    Your next appointment:   6 month(s)  Provider:     Francis Dowse, PA-C

## 2023-02-28 ENCOUNTER — Ambulatory Visit: Payer: Medicare Other | Admitting: Physician Assistant

## 2023-03-17 DIAGNOSIS — J0110 Acute frontal sinusitis, unspecified: Secondary | ICD-10-CM | POA: Diagnosis not present

## 2023-03-24 DIAGNOSIS — G2581 Restless legs syndrome: Secondary | ICD-10-CM | POA: Diagnosis not present

## 2023-03-24 DIAGNOSIS — Z8679 Personal history of other diseases of the circulatory system: Secondary | ICD-10-CM | POA: Diagnosis not present

## 2023-03-24 DIAGNOSIS — G4700 Insomnia, unspecified: Secondary | ICD-10-CM | POA: Diagnosis not present

## 2023-03-24 DIAGNOSIS — F419 Anxiety disorder, unspecified: Secondary | ICD-10-CM | POA: Diagnosis not present

## 2023-03-24 DIAGNOSIS — E7800 Pure hypercholesterolemia, unspecified: Secondary | ICD-10-CM | POA: Diagnosis not present

## 2023-03-24 DIAGNOSIS — E669 Obesity, unspecified: Secondary | ICD-10-CM | POA: Diagnosis not present

## 2023-03-24 DIAGNOSIS — Z23 Encounter for immunization: Secondary | ICD-10-CM | POA: Diagnosis not present

## 2023-03-24 DIAGNOSIS — I10 Essential (primary) hypertension: Secondary | ICD-10-CM | POA: Diagnosis not present

## 2023-03-24 DIAGNOSIS — N3281 Overactive bladder: Secondary | ICD-10-CM | POA: Diagnosis not present

## 2023-04-28 DIAGNOSIS — H2513 Age-related nuclear cataract, bilateral: Secondary | ICD-10-CM | POA: Diagnosis not present

## 2023-04-28 DIAGNOSIS — H35373 Puckering of macula, bilateral: Secondary | ICD-10-CM | POA: Diagnosis not present

## 2023-04-28 DIAGNOSIS — H04123 Dry eye syndrome of bilateral lacrimal glands: Secondary | ICD-10-CM | POA: Diagnosis not present

## 2023-04-30 DIAGNOSIS — R051 Acute cough: Secondary | ICD-10-CM | POA: Diagnosis not present

## 2023-04-30 DIAGNOSIS — Z20822 Contact with and (suspected) exposure to covid-19: Secondary | ICD-10-CM | POA: Diagnosis not present

## 2023-07-29 DIAGNOSIS — H2511 Age-related nuclear cataract, right eye: Secondary | ICD-10-CM | POA: Diagnosis not present

## 2023-07-29 DIAGNOSIS — H2513 Age-related nuclear cataract, bilateral: Secondary | ICD-10-CM | POA: Diagnosis not present

## 2023-07-29 DIAGNOSIS — H18413 Arcus senilis, bilateral: Secondary | ICD-10-CM | POA: Diagnosis not present

## 2023-07-29 DIAGNOSIS — H25013 Cortical age-related cataract, bilateral: Secondary | ICD-10-CM | POA: Diagnosis not present

## 2023-07-29 DIAGNOSIS — H25043 Posterior subcapsular polar age-related cataract, bilateral: Secondary | ICD-10-CM | POA: Diagnosis not present

## 2023-08-31 DIAGNOSIS — H2511 Age-related nuclear cataract, right eye: Secondary | ICD-10-CM | POA: Diagnosis not present

## 2023-09-01 DIAGNOSIS — H2512 Age-related nuclear cataract, left eye: Secondary | ICD-10-CM | POA: Diagnosis not present

## 2023-09-01 DIAGNOSIS — H25012 Cortical age-related cataract, left eye: Secondary | ICD-10-CM | POA: Diagnosis not present

## 2023-09-01 DIAGNOSIS — H25042 Posterior subcapsular polar age-related cataract, left eye: Secondary | ICD-10-CM | POA: Diagnosis not present

## 2023-09-28 DIAGNOSIS — I10 Essential (primary) hypertension: Secondary | ICD-10-CM | POA: Diagnosis not present

## 2023-09-28 DIAGNOSIS — R42 Dizziness and giddiness: Secondary | ICD-10-CM | POA: Diagnosis not present

## 2023-09-28 DIAGNOSIS — H2512 Age-related nuclear cataract, left eye: Secondary | ICD-10-CM | POA: Diagnosis not present

## 2023-10-13 DIAGNOSIS — I10 Essential (primary) hypertension: Secondary | ICD-10-CM | POA: Diagnosis not present

## 2023-10-21 ENCOUNTER — Other Ambulatory Visit: Payer: Self-pay | Admitting: Student

## 2023-11-03 DIAGNOSIS — Z0000 Encounter for general adult medical examination without abnormal findings: Secondary | ICD-10-CM | POA: Diagnosis not present

## 2023-11-03 DIAGNOSIS — N3281 Overactive bladder: Secondary | ICD-10-CM | POA: Diagnosis not present

## 2023-11-03 DIAGNOSIS — N183 Chronic kidney disease, stage 3 unspecified: Secondary | ICD-10-CM | POA: Diagnosis not present

## 2023-11-03 DIAGNOSIS — Z1231 Encounter for screening mammogram for malignant neoplasm of breast: Secondary | ICD-10-CM | POA: Diagnosis not present

## 2023-11-03 DIAGNOSIS — I10 Essential (primary) hypertension: Secondary | ICD-10-CM | POA: Diagnosis not present

## 2023-11-03 DIAGNOSIS — E7800 Pure hypercholesterolemia, unspecified: Secondary | ICD-10-CM | POA: Diagnosis not present

## 2023-11-03 DIAGNOSIS — F419 Anxiety disorder, unspecified: Secondary | ICD-10-CM | POA: Diagnosis not present

## 2023-11-03 DIAGNOSIS — Z13820 Encounter for screening for osteoporosis: Secondary | ICD-10-CM | POA: Diagnosis not present

## 2023-12-06 DIAGNOSIS — M8589 Other specified disorders of bone density and structure, multiple sites: Secondary | ICD-10-CM | POA: Diagnosis not present

## 2023-12-06 DIAGNOSIS — M85851 Other specified disorders of bone density and structure, right thigh: Secondary | ICD-10-CM | POA: Diagnosis not present

## 2024-01-22 ENCOUNTER — Other Ambulatory Visit: Payer: Self-pay | Admitting: Cardiology
